# Patient Record
Sex: Female | Born: 1995 | Race: White | Hispanic: No | Marital: Married | Smoking: Former smoker
Health system: Southern US, Community
[De-identification: ages and names within clinical notes are randomized; demographics above are authoritative.]

## PROBLEM LIST (undated history)

## (undated) ENCOUNTER — Inpatient Hospital Stay (HOSPITAL_COMMUNITY): Payer: Self-pay

## (undated) DIAGNOSIS — N83209 Unspecified ovarian cyst, unspecified side: Secondary | ICD-10-CM

## (undated) DIAGNOSIS — F32A Depression, unspecified: Secondary | ICD-10-CM

## (undated) DIAGNOSIS — D649 Anemia, unspecified: Secondary | ICD-10-CM

## (undated) DIAGNOSIS — J45909 Unspecified asthma, uncomplicated: Secondary | ICD-10-CM

## (undated) DIAGNOSIS — I951 Orthostatic hypotension: Secondary | ICD-10-CM

## (undated) DIAGNOSIS — B999 Unspecified infectious disease: Secondary | ICD-10-CM

## (undated) DIAGNOSIS — F419 Anxiety disorder, unspecified: Secondary | ICD-10-CM

## (undated) DIAGNOSIS — R400 Somnolence: Secondary | ICD-10-CM

## (undated) DIAGNOSIS — F329 Major depressive disorder, single episode, unspecified: Secondary | ICD-10-CM

## (undated) DIAGNOSIS — Z87440 Personal history of urinary (tract) infections: Secondary | ICD-10-CM

## (undated) HISTORY — DX: Depression, unspecified: F32.A

## (undated) HISTORY — DX: Anxiety disorder, unspecified: F41.9

## (undated) HISTORY — DX: Somnolence: R40.0

## (undated) HISTORY — DX: Personal history of urinary (tract) infections: Z87.440

## (undated) HISTORY — PX: NO PAST SURGERIES: SHX2092

---
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
## 2017-03-01 ENCOUNTER — Telehealth (HOSPITAL_COMMUNITY): Payer: Self-pay | Admitting: *Deleted

## 2017-03-01 ENCOUNTER — Encounter (HOSPITAL_COMMUNITY): Payer: Self-pay

## 2017-03-01 ENCOUNTER — Other Ambulatory Visit (HOSPITAL_COMMUNITY): Payer: Self-pay | Admitting: *Deleted

## 2017-03-01 DIAGNOSIS — M2609 Other specified anomalies of jaw size: Secondary | ICD-10-CM

## 2017-03-02 ENCOUNTER — Encounter (HOSPITAL_COMMUNITY): Payer: Self-pay

## 2017-03-02 ENCOUNTER — Ambulatory Visit (HOSPITAL_COMMUNITY)
Admission: RE | Admit: 2017-03-02 | Discharge: 2017-03-02 | Disposition: A | Discharge status: Home / Self Care | Payer: 59 | Source: Ambulatory Visit | Attending: Nurse Practitioner | Admitting: Nurse Practitioner

## 2017-03-02 ENCOUNTER — Ambulatory Visit (HOSPITAL_COMMUNITY)
Admission: RE | Admit: 2017-03-02 | Discharge: 2017-03-02 | Disposition: A | Discharge status: Home / Self Care | Payer: 59 | Source: Ambulatory Visit | Attending: Obstetrics & Gynecology | Admitting: Obstetrics & Gynecology

## 2017-03-02 ENCOUNTER — Other Ambulatory Visit (HOSPITAL_COMMUNITY): Payer: Self-pay | Admitting: Maternal and Fetal Medicine

## 2017-03-02 DIAGNOSIS — Z3A22 22 weeks gestation of pregnancy: Secondary | ICD-10-CM | POA: Insufficient documentation

## 2017-03-02 DIAGNOSIS — O283 Abnormal ultrasonic finding on antenatal screening of mother: Secondary | ICD-10-CM | POA: Insufficient documentation

## 2017-03-02 DIAGNOSIS — M2609 Other specified anomalies of jaw size: Secondary | ICD-10-CM

## 2017-03-02 DIAGNOSIS — Z363 Encounter for antenatal screening for malformations: Secondary | ICD-10-CM | POA: Diagnosis not present

## 2017-03-02 DIAGNOSIS — O35AXX0 Maternal care for other (suspected) fetal abnormality and damage, fetal facial anomalies, not applicable or unspecified: Secondary | ICD-10-CM | POA: Insufficient documentation

## 2017-03-02 DIAGNOSIS — O358XX0 Maternal care for other (suspected) fetal abnormality and damage, not applicable or unspecified: Secondary | ICD-10-CM

## 2017-03-02 HISTORY — DX: Major depressive disorder, single episode, unspecified: F32.9

## 2017-03-02 HISTORY — DX: Anxiety disorder, unspecified: F41.9

## 2017-03-02 HISTORY — DX: Depression, unspecified: F32.A

## 2017-03-02 HISTORY — DX: Unspecified ovarian cyst, unspecified side: N83.209

## 2017-03-02 NOTE — Addendum Note (Signed)
Encounter addended by: Drue NovelKeatts, Sherran Margolis J, RDMS on: 03/02/2017  3:02 PM<BR>    Actions taken: Imaging Exam ended

## 2017-03-02 NOTE — Consult Note (Signed)
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 Langston MaskerMorris' office and was noted to have a small jaw. She was referred for evaluation. This pregnancy has been unremarkable so far. First trimester screen was negative, second trimester MSAFP was normal, and SMN screening was 2-copy reduced risk. There is no significant genetic history in the immediate family OB History: OB History    Gravida Para Term Preterm AB Living   1             SAB TAB Ectopic Multiple Live Births                  PMH:  Past Medical History:  Diagnosis Date  . Anxiety   . Asthma   . Depression   . Ovarian cyst     PSH: No past surgical history on file. Meds: see EPIC section Allergies: NKDA FH: see EPIC section Soc: see EPIC section  Review of Systems: no vaginal bleeding or cramping/contractions, no LOF, no nausea/vomiting. All other systems reviewed and are negative.  PE:  VS: see EPIC section GEN: well-appearing female ABD: gravid, NT  Please see separate document for fetal ultrasound report.  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 Profile views are certainly worrisome for a small jaw but it seems to be borderline between a cosmetic level of micrognathia and a pathologically shortened mandible. There are no other findings on scan that suggest that this is a part of any genetic syndrome. In summary, further evaluation will be necessary. I have asked the patient to return for repeat scan in 4 weeks. I discussed the findings with the patient and the need for possible delivery in a tertiary care center with pediatric ENT support if the baby does prove to have an abnormally small mandible. It may take several evaluations before that decision can  be made.  Thank you for the opportunity to be a part of the care of Anne [name]. Please contact our office if we can be of further assistance.   I spent approximately 30 minutes with this patient with over 50% of time spent in face-to-face counseling.

## 2017-03-03 ENCOUNTER — Other Ambulatory Visit (HOSPITAL_COMMUNITY): Payer: Self-pay

## 2017-03-03 ENCOUNTER — Other Ambulatory Visit (HOSPITAL_COMMUNITY): Payer: Self-pay | Admitting: *Deleted

## 2017-03-03 ENCOUNTER — Encounter (HOSPITAL_COMMUNITY): Payer: Self-pay

## 2017-03-03 DIAGNOSIS — O358XX0 Maternal care for other (suspected) fetal abnormality and damage, not applicable or unspecified: Secondary | ICD-10-CM

## 2017-03-03 DIAGNOSIS — O35AXX0 Maternal care for other (suspected) fetal abnormality and damage, fetal facial anomalies, not applicable or unspecified: Secondary | ICD-10-CM

## 2017-03-30 ENCOUNTER — Encounter (HOSPITAL_COMMUNITY): Payer: Self-pay

## 2017-03-30 ENCOUNTER — Ambulatory Visit (HOSPITAL_COMMUNITY)
Admission: RE | Admit: 2017-03-30 | Discharge: 2017-03-30 | Disposition: A | Discharge status: Home / Self Care | Payer: 59 | Source: Ambulatory Visit | Attending: Obstetrics & Gynecology | Admitting: Obstetrics & Gynecology

## 2017-03-30 ENCOUNTER — Other Ambulatory Visit (HOSPITAL_COMMUNITY): Payer: Self-pay | Admitting: Obstetrics and Gynecology

## 2017-03-30 DIAGNOSIS — Z3A26 26 weeks gestation of pregnancy: Secondary | ICD-10-CM

## 2017-03-30 DIAGNOSIS — O358XX0 Maternal care for other (suspected) fetal abnormality and damage, not applicable or unspecified: Secondary | ICD-10-CM

## 2017-03-30 DIAGNOSIS — O321XX0 Maternal care for breech presentation, not applicable or unspecified: Secondary | ICD-10-CM | POA: Insufficient documentation

## 2017-03-30 DIAGNOSIS — O35AXX0 Maternal care for other (suspected) fetal abnormality and damage, fetal facial anomalies, not applicable or unspecified: Secondary | ICD-10-CM

## 2017-03-30 DIAGNOSIS — O283 Abnormal ultrasonic finding on antenatal screening of mother: Secondary | ICD-10-CM | POA: Insufficient documentation

## 2017-03-30 DIAGNOSIS — Z362 Encounter for other antenatal screening follow-up: Secondary | ICD-10-CM | POA: Diagnosis not present

## 2017-05-01 ENCOUNTER — Other Ambulatory Visit (HOSPITAL_COMMUNITY): Payer: Self-pay | Admitting: *Deleted

## 2017-05-01 DIAGNOSIS — O359XX0 Maternal care for (suspected) fetal abnormality and damage, unspecified, not applicable or unspecified: Secondary | ICD-10-CM

## 2017-05-05 ENCOUNTER — Ambulatory Visit (HOSPITAL_COMMUNITY)
Admission: RE | Admit: 2017-05-05 | Discharge: 2017-05-05 | Disposition: A | Discharge status: Home / Self Care | Payer: 59 | Source: Ambulatory Visit | Attending: Obstetrics & Gynecology | Admitting: Obstetrics & Gynecology

## 2017-05-05 ENCOUNTER — Encounter (HOSPITAL_COMMUNITY): Payer: Self-pay

## 2017-05-05 DIAGNOSIS — O359XX0 Maternal care for (suspected) fetal abnormality and damage, unspecified, not applicable or unspecified: Secondary | ICD-10-CM | POA: Insufficient documentation

## 2017-05-05 DIAGNOSIS — O283 Abnormal ultrasonic finding on antenatal screening of mother: Secondary | ICD-10-CM | POA: Diagnosis not present

## 2017-05-05 DIAGNOSIS — Z362 Encounter for other antenatal screening follow-up: Secondary | ICD-10-CM | POA: Insufficient documentation

## 2017-05-05 DIAGNOSIS — Z3A31 31 weeks gestation of pregnancy: Secondary | ICD-10-CM | POA: Diagnosis not present

## 2017-05-08 ENCOUNTER — Other Ambulatory Visit (HOSPITAL_COMMUNITY): Payer: Self-pay | Admitting: *Deleted

## 2017-05-08 DIAGNOSIS — O359XX0 Maternal care for (suspected) fetal abnormality and damage, unspecified, not applicable or unspecified: Secondary | ICD-10-CM

## 2017-05-16 NOTE — L&D Delivery Note (Signed)
Delivery Note At 10:54 PM a viable female was delivered via Vaginal, Spontaneous (Presentation: LOA;  ).  APGAR: 7, 9; weight pending.    Placenta status: routine , .  Cord:  with the following complications: fetal tachycardia to 180s last ~10 min of pushing.  Cord pH: sent, 7.13 Mild uterine atony requiring 800mcg cytotec and draining the bladder with resolution of bleeding Periurethral repaired with red rubber in place to ensure proper repair  Anesthesia:  None Episiotomy: None Lacerations: 2nd degree;Periurethral Suture Repair: 3.0 vicryl Est. Blood Loss (mL): 400  It's a girl - "Anne [name]"! Mom to postpartum.  Baby to Couplet care / Skin to Skin.  Ranae Pilalise Jennifer Sufian Ravi 07/02/2017, 11:29 PM

## 2017-06-02 ENCOUNTER — Ambulatory Visit (HOSPITAL_COMMUNITY)
Admission: RE | Admit: 2017-06-02 | Discharge: 2017-06-02 | Disposition: A | Discharge status: Home / Self Care | Payer: Managed Care, Other (non HMO) | Source: Ambulatory Visit | Attending: Obstetrics & Gynecology | Admitting: Obstetrics & Gynecology

## 2017-06-02 ENCOUNTER — Encounter (HOSPITAL_COMMUNITY): Payer: Self-pay

## 2017-06-02 DIAGNOSIS — Z3A35 35 weeks gestation of pregnancy: Secondary | ICD-10-CM | POA: Insufficient documentation

## 2017-06-02 DIAGNOSIS — O359XX0 Maternal care for (suspected) fetal abnormality and damage, unspecified, not applicable or unspecified: Secondary | ICD-10-CM | POA: Insufficient documentation

## 2017-06-07 LAB — OB RESULTS CONSOLE GBS: STREP GROUP B AG: NEGATIVE

## 2017-06-25 ENCOUNTER — Inpatient Hospital Stay (HOSPITAL_COMMUNITY): Payer: 59

## 2017-06-25 ENCOUNTER — Encounter (HOSPITAL_COMMUNITY): Payer: Self-pay

## 2017-06-25 ENCOUNTER — Inpatient Hospital Stay (HOSPITAL_COMMUNITY)
Admission: AD | Admit: 2017-06-25 | Discharge: 2017-06-25 | Disposition: A | Discharge status: Home / Self Care | Payer: 59 | Source: Ambulatory Visit | Attending: Obstetrics and Gynecology | Admitting: Obstetrics and Gynecology

## 2017-06-25 DIAGNOSIS — Z3689 Encounter for other specified antenatal screening: Secondary | ICD-10-CM | POA: Insufficient documentation

## 2017-06-25 DIAGNOSIS — O368130 Decreased fetal movements, third trimester, not applicable or unspecified: Secondary | ICD-10-CM | POA: Insufficient documentation

## 2017-06-25 DIAGNOSIS — F419 Anxiety disorder, unspecified: Secondary | ICD-10-CM | POA: Diagnosis not present

## 2017-06-25 DIAGNOSIS — O99513 Diseases of the respiratory system complicating pregnancy, third trimester: Secondary | ICD-10-CM | POA: Insufficient documentation

## 2017-06-25 DIAGNOSIS — Z3A39 39 weeks gestation of pregnancy: Secondary | ICD-10-CM | POA: Diagnosis present

## 2017-06-25 DIAGNOSIS — O3483 Maternal care for other abnormalities of pelvic organs, third trimester: Secondary | ICD-10-CM | POA: Diagnosis not present

## 2017-06-25 DIAGNOSIS — N83209 Unspecified ovarian cyst, unspecified side: Secondary | ICD-10-CM | POA: Insufficient documentation

## 2017-06-25 DIAGNOSIS — Z79899 Other long term (current) drug therapy: Secondary | ICD-10-CM | POA: Insufficient documentation

## 2017-06-25 DIAGNOSIS — Z87891 Personal history of nicotine dependence: Secondary | ICD-10-CM | POA: Diagnosis not present

## 2017-06-25 DIAGNOSIS — O99343 Other mental disorders complicating pregnancy, third trimester: Secondary | ICD-10-CM | POA: Diagnosis not present

## 2017-06-25 DIAGNOSIS — J45909 Unspecified asthma, uncomplicated: Secondary | ICD-10-CM | POA: Diagnosis not present

## 2017-06-25 DIAGNOSIS — F329 Major depressive disorder, single episode, unspecified: Secondary | ICD-10-CM | POA: Insufficient documentation

## 2017-06-25 DIAGNOSIS — O368131 Decreased fetal movements, third trimester, fetus 1: Secondary | ICD-10-CM

## 2017-06-25 DIAGNOSIS — O288 Other abnormal findings on antenatal screening of mother: Secondary | ICD-10-CM

## 2017-06-25 NOTE — MAU Note (Signed)
Patient presents with decreased fetal movement, stating baby not as active as usual.

## 2017-06-25 NOTE — MAU Provider Note (Signed)
Chief Complaint:  Decreased Fetal Movement   None     HPI: Anne FeilSarah J [name] is a 22 y.o. G1P0 at 4642w1d who presents to maternity admissions reporting decreased fetal movement x 2 days. She reports that yesterday she noticed less movement, despite eating and drinking normally, and resting on her side.  Today, she is feeling movement, but it continues to be less than usual.  She feels an increase in movement a few minutes after monitors are applied in MAU.  There are no other associated symptoms and no other treatments were tried.   HPI  Past Medical History: Past Medical History:  Diagnosis Date  . Anxiety   . Asthma   . Depression   . Ovarian cyst     Past obstetric history: OB History  Gravida Para Term Preterm AB Living  1         0  SAB TAB Ectopic Multiple Live Births               # Outcome Date GA Lbr Len/2nd Weight Sex Delivery Anes PTL Lv  1 Current               Past Surgical History: History reviewed. No pertinent surgical history.  Family History: Family History  Problem Relation Age of Onset  . Depression Mother   . Anxiety disorder Mother   . Depression Maternal Grandmother   . Anxiety disorder Maternal Grandmother   . Depression Maternal Grandfather   . Anxiety disorder Maternal Grandfather     Social History: Social History   Tobacco Use  . Smoking status: Former Games developermoker  . Smokeless tobacco: Never Used  Substance Use Topics  . Alcohol use: No    Alcohol/week: 0.0 oz  . Drug use: No    Allergies:  Allergies  Allergen Reactions  . Other     Seasonal Allergies    Meds:  No medications prior to admission.    ROS:  Review of Systems  Constitutional: Negative for chills, fatigue and fever.  Eyes: Negative for visual disturbance.  Respiratory: Negative for shortness of breath.   Cardiovascular: Negative for chest pain.  Gastrointestinal: Negative for abdominal pain, nausea and vomiting.  Genitourinary: Negative for difficulty  urinating, dysuria, flank pain, pelvic pain, vaginal bleeding, vaginal discharge and vaginal pain.  Neurological: Negative for dizziness and headaches.  Psychiatric/Behavioral: Negative.      I have reviewed patient's Past Medical Hx, Surgical Hx, Family Hx, Social Hx, medications and allergies.   Physical Exam   Patient Vitals for the past 24 hrs:  BP Temp Temp src Pulse Resp Height Weight  06/25/17 1127 138/77 - - - - - -  06/25/17 0901 136/87 98.7 F (37.1 C) Oral 99 18 - -  06/25/17 0852 - - - - - 5\' 1"  (1.549 m) 159 lb (72.1 kg)   Constitutional: Well-developed, well-nourished female in no acute distress.  Cardiovascular: normal rate Respiratory: normal effort GI: Abd soft, non-tender, gravid appropriate for gestational age.  MS: Extremities nontender, no edema, normal ROM Neurologic: Alert and oriented x 4.  GU: Neg CVAT.     FHT:  Baseline 130 , moderate variability, accelerations present, deceleration x1 present with FHR down to 90s, return to 150, down to 100s all lasting 1 minute when monitors first applied Contractions: some irritability, mild to palpation   Labs: No results found for this or any previous visit (from the past 24 hour(s)). O/Positive/-- (07/17 0000)  Imaging:    MAU Course/MDM:  NST reviewed.  Nonreactive with initial decel but reactive with accels following decel x1 BPP ordered, result 8/8 Pt feeling normal movement in MAU Consult Dr Rana Snare with presentation, exam findings and test results.  Dr Rana Snare reviewed FHR tracing D/C home with fetal kick counts and labor precautions reviewed   Assessment: 1. NST (non-stress test) reactive   2. NST (non-stress test) nonreactive   3. Decreased fetal movements, third trimester, fetus 1   4. [redacted] weeks gestation of pregnancy     Plan: Discharge home Labor precautions and fetal kick counts  Follow-up Information    [place] Follow up.   Why:  As scheduled, return to MAU  as needed for emergencies Contact information: [address] [place] [address] [phone]          Allergies as of 06/25/2017      Reactions   Other    Seasonal Allergies      Medication List    TAKE these medications   albuterol 108 (90 Base) MCG/ACT inhaler Commonly known as:  PROVENTIL HFA;VENTOLIN HFA Inhale 1-2 puffs into the lungs every 6 (six) hours as needed for wheezing or shortness of breath.   calcium carbonate 500 MG chewable tablet Commonly known as:  TUMS - dosed in mg elemental calcium Chew 1-2 tablets by mouth daily as needed for indigestion or heartburn.   diphenhydrAMINE 25 MG tablet Commonly known as:  BENADRYL Take 25 mg by mouth daily as needed for allergies.       Sharen Counter Certified Nurse-Midwife 06/25/2017 7:11 PM

## 2017-06-26 ENCOUNTER — Telehealth (HOSPITAL_COMMUNITY): Payer: Self-pay | Admitting: *Deleted

## 2017-06-26 ENCOUNTER — Encounter (HOSPITAL_COMMUNITY): Payer: Self-pay | Admitting: *Deleted

## 2017-06-26 NOTE — Telephone Encounter (Signed)
Preadmission screen  

## 2017-07-02 ENCOUNTER — Inpatient Hospital Stay (HOSPITAL_COMMUNITY)
Admission: AD | Admit: 2017-07-02 | Discharge: 2017-07-04 | DRG: 807 | Disposition: A | Discharge status: Home / Self Care | Payer: 59 | Source: Ambulatory Visit | Attending: Obstetrics and Gynecology | Admitting: Obstetrics and Gynecology

## 2017-07-02 ENCOUNTER — Encounter (HOSPITAL_COMMUNITY): Payer: Self-pay

## 2017-07-02 ENCOUNTER — Other Ambulatory Visit: Payer: Self-pay

## 2017-07-02 DIAGNOSIS — Z3483 Encounter for supervision of other normal pregnancy, third trimester: Secondary | ICD-10-CM | POA: Diagnosis present

## 2017-07-02 DIAGNOSIS — Z3490 Encounter for supervision of normal pregnancy, unspecified, unspecified trimester: Secondary | ICD-10-CM

## 2017-07-02 DIAGNOSIS — O358XX0 Maternal care for other (suspected) fetal abnormality and damage, not applicable or unspecified: Principal | ICD-10-CM | POA: Diagnosis present

## 2017-07-02 DIAGNOSIS — Z3A40 40 weeks gestation of pregnancy: Secondary | ICD-10-CM | POA: Diagnosis not present

## 2017-07-02 DIAGNOSIS — Z87891 Personal history of nicotine dependence: Secondary | ICD-10-CM

## 2017-07-02 LAB — PROTEIN / CREATININE RATIO, URINE
CREATININE, URINE: 97 mg/dL
PROTEIN CREATININE RATIO: 0.22 mg/mg{creat} — AB (ref 0.00–0.15)
TOTAL PROTEIN, URINE: 21 mg/dL

## 2017-07-02 LAB — COMPREHENSIVE METABOLIC PANEL
ALT: 7 U/L — ABNORMAL LOW (ref 14–54)
AST: 17 U/L (ref 15–41)
Albumin: 3 g/dL — ABNORMAL LOW (ref 3.5–5.0)
Alkaline Phosphatase: 157 U/L — ABNORMAL HIGH (ref 38–126)
Anion gap: 10 (ref 5–15)
BUN: 7 mg/dL (ref 6–20)
CHLORIDE: 106 mmol/L (ref 101–111)
CO2: 17 mmol/L — AB (ref 22–32)
Calcium: 8.7 mg/dL — ABNORMAL LOW (ref 8.9–10.3)
Creatinine, Ser: 0.49 mg/dL (ref 0.44–1.00)
GFR calc Af Amer: >60 mL/min (ref >60)
GFR calc non Af Amer: >60 mL/min (ref >60)
GLUCOSE: 94 mg/dL (ref 65–99)
Potassium: 3.8 mmol/L (ref 3.5–5.1)
SODIUM: 133 mmol/L — AB (ref 135–145)
Total Bilirubin: 0.8 mg/dL (ref 0.3–1.2)
Total Protein: 6.8 g/dL (ref 6.5–8.1)

## 2017-07-02 LAB — CBC WITH DIFFERENTIAL/PLATELET
BASOS ABS: 0 10*3/uL (ref 0.0–0.1)
Basophils Relative: 0 %
EOS ABS: 0 10*3/uL (ref 0.0–0.7)
EOS PCT: 0 %
HCT: 29.6 % — ABNORMAL LOW (ref 36.0–46.0)
HEMOGLOBIN: 9.8 g/dL — AB (ref 12.0–15.0)
LYMPHS PCT: 14 %
Lymphs Abs: 2 10*3/uL (ref 0.7–4.0)
MCH: 28.2 pg (ref 26.0–34.0)
MCHC: 33.1 g/dL (ref 30.0–36.0)
MCV: 85.1 fL (ref 78.0–100.0)
Monocytes Absolute: 0.7 10*3/uL (ref 0.1–1.0)
Monocytes Relative: 5 %
NEUTROS PCT: 81 %
Neutro Abs: 11.5 10*3/uL — ABNORMAL HIGH (ref 1.7–7.7)
PLATELETS: 272 10*3/uL (ref 150–400)
RBC: 3.48 MIL/uL — AB (ref 3.87–5.11)
RDW: 13.5 % (ref 11.5–15.5)
WBC: 14.2 10*3/uL — ABNORMAL HIGH (ref 4.0–10.5)

## 2017-07-02 LAB — TYPE AND SCREEN
ABO/RH(D): O POS
ANTIBODY SCREEN: NEGATIVE

## 2017-07-02 MED ORDER — IBUPROFEN 600 MG PO TABS
600.0000 mg | ORAL_TABLET | Freq: Four times a day (QID) | ORAL | Status: DC
  Administered 2017-07-03 – 2017-07-04 (×8): 600 mg via ORAL
  Filled 2017-07-02 (×7): qty 1

## 2017-07-02 MED ORDER — LIDOCAINE HCL (PF) 1 % IJ SOLN
30.0000 mL | INTRAMUSCULAR | Status: DC | PRN
  Administered 2017-07-02: 30 mL via SUBCUTANEOUS
  Filled 2017-07-02: qty 30

## 2017-07-02 MED ORDER — FENTANYL CITRATE (PF) 100 MCG/2ML IJ SOLN
50.0000 ug | INTRAMUSCULAR | Status: DC | PRN
  Administered 2017-07-02: 100 ug via INTRAVENOUS
  Filled 2017-07-02: qty 2

## 2017-07-02 MED ORDER — ACETAMINOPHEN 325 MG PO TABS: 650.0000 mg | ORAL_TABLET | ORAL | Status: DC | PRN

## 2017-07-02 MED ORDER — MISOPROSTOL 200 MCG PO TABS
ORAL_TABLET | ORAL | Status: AC
  Filled 2017-07-02: qty 4

## 2017-07-02 MED ORDER — OXYTOCIN BOLUS FROM INFUSION
500.0000 mL | Freq: Once | INTRAVENOUS | Status: AC
  Administered 2017-07-02: 500 mL via INTRAVENOUS

## 2017-07-02 MED ORDER — OXYCODONE-ACETAMINOPHEN 5-325 MG PO TABS: 2.0000 | ORAL_TABLET | ORAL | Status: DC | PRN

## 2017-07-02 MED ORDER — OXYCODONE-ACETAMINOPHEN 5-325 MG PO TABS: 1.0000 | ORAL_TABLET | ORAL | Status: DC | PRN

## 2017-07-02 MED ORDER — MISOPROSTOL 200 MCG PO TABS
800.0000 ug | ORAL_TABLET | Freq: Once | ORAL | Status: AC
  Administered 2017-07-02: 800 ug via RECTAL

## 2017-07-02 MED ORDER — CARBOPROST TROMETHAMINE 250 MCG/ML IM SOLN
250.0000 ug | Freq: Once | INTRAMUSCULAR | Status: AC
  Administered 2017-07-03: 250 ug via INTRAMUSCULAR
  Filled 2017-07-02: qty 1

## 2017-07-02 MED ORDER — OXYTOCIN 40 UNITS IN LACTATED RINGERS INFUSION - SIMPLE MED
2.5000 [IU]/h | INTRAVENOUS | Status: DC
  Administered 2017-07-02: 2.5 [IU]/h via INTRAVENOUS
  Filled 2017-07-02: qty 1000

## 2017-07-02 MED ORDER — LACTATED RINGERS IV SOLN
INTRAVENOUS | Status: DC
  Administered 2017-07-02: 18:00:00 via INTRAVENOUS

## 2017-07-02 MED ORDER — CARBOPROST TROMETHAMINE 250 MCG/ML IM SOLN
INTRAMUSCULAR | Status: AC
  Filled 2017-07-02: qty 1

## 2017-07-02 MED ORDER — FLEET ENEMA 7-19 GM/118ML RE ENEM: 1.0000 | ENEMA | RECTAL | Status: DC | PRN

## 2017-07-02 MED ORDER — ONDANSETRON HCL 4 MG/2ML IJ SOLN
4.0000 mg | Freq: Four times a day (QID) | INTRAMUSCULAR | Status: DC | PRN
  Administered 2017-07-02: 4 mg via INTRAVENOUS
  Filled 2017-07-02: qty 2

## 2017-07-02 MED ORDER — SOD CITRATE-CITRIC ACID 500-334 MG/5ML PO SOLN: 30.0000 mL | ORAL | Status: DC | PRN

## 2017-07-02 MED ORDER — LACTATED RINGERS IV SOLN: 500.0000 mL | INTRAVENOUS | Status: DC | PRN

## 2017-07-02 NOTE — Progress Notes (Signed)
C/c/+1, will start to push 

## 2017-07-02 NOTE — MAU Note (Addendum)
Pt has been having ctx since 2pm today. No bleeding, no LOF. + FM

## 2017-07-02 NOTE — H&P (Signed)
Anne FeilSarah J [name] is a 22 y.o. female presenting for labor, cervical change from 1cm to 2.5cm in MAU. OB History    Gravida Para Term Preterm AB Living   1         0   SAB TAB Ectopic Multiple Live Births                 Past Medical History:  Diagnosis Date  . Anxiety   . Asthma   . Depression   . Ovarian cyst    Past Surgical History:  Procedure Laterality Date  . NO PAST SURGERIES     Family History: family history includes Anxiety disorder in her maternal grandfather, maternal grandmother, and mother; Depression in her maternal grandfather, maternal grandmother, and mother. Social History:  reports that she has quit smoking. she has never used smokeless tobacco. She reports that she does not drink alcohol or use drugs.     Maternal Diabetes: No Genetic Screening: Normal Maternal Ultrasounds/Referrals: Normal Fetal Ultrasounds or other Referrals:  Referred to Materal Fetal Medicine , Other:   Micrognathia Maternal Substance Abuse:  No Significant Maternal Medications:  None Significant Maternal Lab Results:  None Other Comments:  None  ROS History Dilation: 2.5 Effacement (%): 80 Station: -2 Exam by:: Ginnie Smartachel Schmidt RN Blood pressure 123/73, pulse 82, temperature 98 F (36.7 C), temperature source Oral, resp. rate 16, height 5\' 1"  (1.549 m), weight 68.9 kg (152 lb), last menstrual period 09/09/2016. Exam Physical Exam  Prenatal labs: ABO, Rh: O/Positive/-- (07/17 0000) Antibody: Negative (07/17 0000) Rubella: Immune (07/17 0000) RPR: Nonreactive (07/17 0000)  HBsAg: Negative (07/17 0000)  HIV: Non-reactive (07/17 0000)  GBS: Negative (01/23 0000)   Assessment/Plan: 21yo G1P0 @40 .1wga presenting in early labor. Also had two elevated BP on arrival in 130s/90s - now WNL. PIH labs WNL. Pregnancy c/b micrognathia with normal first trimester screen. She has a h/o anxiety/depression and asthma that is well controlled. Plan for expectant management at this time - patient  would like "a natural birth". She is GBS neg.    Ranae Pilalise Jennifer [name] 07/02/2017, 7:20 PM

## 2017-07-03 ENCOUNTER — Encounter (HOSPITAL_COMMUNITY): Payer: Self-pay

## 2017-07-03 LAB — CBC
HCT: 25.2 % — ABNORMAL LOW (ref 36.0–46.0)
HEMOGLOBIN: 8.7 g/dL — AB (ref 12.0–15.0)
MCH: 28.8 pg (ref 26.0–34.0)
MCHC: 34.5 g/dL (ref 30.0–36.0)
MCV: 83.4 fL (ref 78.0–100.0)
Platelets: 249 10*3/uL (ref 150–400)
RBC: 3.02 MIL/uL — ABNORMAL LOW (ref 3.87–5.11)
RDW: 13.7 % (ref 11.5–15.5)
WBC: 26.9 10*3/uL — AB (ref 4.0–10.5)

## 2017-07-03 LAB — RPR: RPR Ser Ql: NONREACTIVE

## 2017-07-03 LAB — ABO/RH: ABO/RH(D): O POS

## 2017-07-03 MED ORDER — SIMETHICONE 80 MG PO CHEW: 80.0000 mg | CHEWABLE_TABLET | ORAL | Status: DC | PRN

## 2017-07-03 MED ORDER — OXYCODONE HCL 5 MG PO TABS: 10.0000 mg | ORAL_TABLET | ORAL | Status: DC | PRN

## 2017-07-03 MED ORDER — OXYCODONE HCL 5 MG PO TABS
5.0000 mg | ORAL_TABLET | ORAL | Status: DC | PRN
  Administered 2017-07-03: 5 mg via ORAL
  Filled 2017-07-03: qty 1

## 2017-07-03 MED ORDER — SENNOSIDES-DOCUSATE SODIUM 8.6-50 MG PO TABS
2.0000 | ORAL_TABLET | ORAL | Status: DC
  Administered 2017-07-03 (×2): 2 via ORAL
  Filled 2017-07-03 (×2): qty 2

## 2017-07-03 MED ORDER — COCONUT OIL OIL: 1.0000 "application " | TOPICAL_OIL | Status: DC | PRN

## 2017-07-03 MED ORDER — TETANUS-DIPHTH-ACELL PERTUSSIS 5-2.5-18.5 LF-MCG/0.5 IM SUSP: 0.5000 mL | Freq: Once | INTRAMUSCULAR | Status: DC

## 2017-07-03 MED ORDER — ONDANSETRON HCL 4 MG/2ML IJ SOLN: 4.0000 mg | INTRAMUSCULAR | Status: DC | PRN

## 2017-07-03 MED ORDER — DIPHENHYDRAMINE HCL 25 MG PO CAPS: 25.0000 mg | ORAL_CAPSULE | Freq: Four times a day (QID) | ORAL | Status: DC | PRN

## 2017-07-03 MED ORDER — ONDANSETRON HCL 4 MG PO TABS: 4.0000 mg | ORAL_TABLET | ORAL | Status: DC | PRN

## 2017-07-03 MED ORDER — ACETAMINOPHEN 325 MG PO TABS: 650.0000 mg | ORAL_TABLET | ORAL | Status: DC | PRN

## 2017-07-03 MED ORDER — WITCH HAZEL-GLYCERIN EX PADS: 1.0000 "application " | MEDICATED_PAD | CUTANEOUS | Status: DC | PRN

## 2017-07-03 MED ORDER — BENZOCAINE-MENTHOL 20-0.5 % EX AERO
1.0000 "application " | INHALATION_SPRAY | CUTANEOUS | Status: DC | PRN
  Administered 2017-07-03: 1 via TOPICAL
  Filled 2017-07-03: qty 56

## 2017-07-03 MED ORDER — ZOLPIDEM TARTRATE 5 MG PO TABS
5.0000 mg | ORAL_TABLET | Freq: Every evening | ORAL | Status: DC | PRN
  Administered 2017-07-04 (×2): 5 mg via ORAL
  Filled 2017-07-03 (×2): qty 1

## 2017-07-03 MED ORDER — PRENATAL MULTIVITAMIN CH
1.0000 | ORAL_TABLET | Freq: Every day | ORAL | Status: DC
  Administered 2017-07-03 – 2017-07-04 (×2): 1 via ORAL
  Filled 2017-07-03 (×2): qty 1

## 2017-07-03 MED ORDER — ALBUTEROL SULFATE (2.5 MG/3ML) 0.083% IN NEBU: 3.0000 mL | INHALATION_SOLUTION | Freq: Four times a day (QID) | RESPIRATORY_TRACT | Status: DC | PRN

## 2017-07-03 MED ORDER — DIBUCAINE 1 % RE OINT: 1.0000 "application " | TOPICAL_OINTMENT | RECTAL | Status: DC | PRN

## 2017-07-03 NOTE — Lactation Note (Addendum)
This note was copied from a baby's chart. Lactation Consultation Note  Patient Name: Anne [name] ZOXWR'UToday's Date: 07/03/2017   Baby with cleft palate in NICU. Mother resting.  Spoke with grandmother. Mother has been pumping but not since early this morning. Recommend pumping. q 2.5-3 hours. LC will return later when mother wakes.  Called to room since mother was now awake. Recommend pumping to motherq 2-5-3 hours. Offered to help with pumping and hand expression and mother declined. Provided mother with yellow dot labels, lacatation brochure, colostrum containers and NICU booklet. Mother falling asleep during consult. Informed RN and she will look out for breastmilk labels from pharmacy.        Maternal Data    Feeding Feeding Type: Bottle Fed - Formula Nipple Type: Dr. Irving BurtonBrowns Preemie(Dr. Brown's speciality ) Length of feed: 15 min  LATCH Score                   Interventions    Lactation Tools Discussed/Used     Consult Status      Dahlia ByesBerkelhammer, [facility] [place] 07/03/2017, 11:04 AM

## 2017-07-03 NOTE — Progress Notes (Signed)
Post Partum Day 1 Subjective: no complaints  Objective: Blood pressure (!) 117/59, pulse 79, temperature 98.6 F (37 C), temperature source Oral, resp. rate 20, height 5\' 1"  (1.549 m), weight 152 lb (68.9 kg), last menstrual period 09/09/2016, SpO2 99 %, unknown if currently breastfeeding.  Physical Exam:  General: alert Lochia: appropriate Uterine Fundus: firm Incision: healing well DVT Evaluation: No evidence of DVT seen on physical exam.  Recent Labs    07/02/17 1629 07/03/17 0515  HGB 9.8* 8.7*  HCT 29.6* 25.2*    Assessment/Plan: Plan for discharge tomorrow   LOS: 1 day   Anne PicaRichard M Naelle Diegel 07/03/2017, 8:05 AM

## 2017-07-03 NOTE — Clinical Social Work Maternal (Signed)
CLINICAL SOCIAL WORK MATERNAL/CHILD NOTE  Patient Details  Name: Anne [name] MRN: [id number] Date of Birth: 04/15/1996  Date:  07/03/2017  Clinical Social Worker Initiating Note:  Bobetta Korf Boyd-Gilyard Date/Time: Initiated:  07/03/17/1456     Child's Name:  Anne [name]   Biological Parents:  Mother, Father   Need for Interpreter:  None   Reason for Referral:  Parental Support of Premature Babies < 32 weeks/or Critically Ill babies   Address:  [address] [place] [address]    Phone number:  [phone] (home)     Additional phone number: [name]'s telephone number is [phone]  Household Members/Support Persons (HM/SP):   Household Member/Support Person 1   HM/SP Name Relationship DOB or Age  HM/SP -1 [name] [name] 02/18/1997  HM/SP -2        HM/SP -3        HM/SP -4        HM/SP -5        HM/SP -6        HM/SP -7        HM/SP -8          Natural Supports (not living in the home):  Immediate Family, Extended Family, Parent([name]'s family will also provide support to the [name] and family.)   Professional Supports:     Employment: Full-time   Type of Work: Pizza Delivery for [employer]   Education:  9 to 11 years   Homebound arranged: No  Financial Resources:  Medicaid, Private Insurance   Other Resources:  WIC, Food Stamps    Cultural/Religious Considerations Which May Impact Care:  None Reported  Strengths:  Ability to meet basic needs , Home prepared for child , Compliance with medical plan , Psychotropic Medications, Understanding of illness   Psychotropic Medications:  Zoloft([name] takes Zoloft PRN)      Pediatrician:       Pediatrician List:   [place]    [place]    [place]    [place]    [place]    [place]      Pediatrician Fax Number:    Risk Factors/Current Problems:  Mental Health Concerns , Transportation    Cognitive State:  Alert , Able to Concentrate , Linear Thinking ,  Insightful    Mood/Affect:  Bright , Comfortable , Happy , Relaxed , Interested    CSW Assessment: CSW met with [name] in room 145 to complete an assessement for NICU admission and hx of anxiety /depression.   When CSW arrived, [name] was resting in bed speaking with [name] on the phone. CSW explained CSW's role.  [name] asked [name] to complete the assessment while [name] was on speaker phone; CSW agreed. [name] was polite, easy to engage, and receptive to meeting with CSW.    CSW asked about [name]'s thoughts and feelings about NICU admission and [name] being a new mother. [name] shared feelings of frustration about knowing about infant's cleft pilate prenatally.  [name] reported attending routine prenatal appointments and medical providers being concerned about other things aside from infant's cleft pilate.  [name] also shared that [name] has not had an opportunity to visit with infant since transfer to NICU; [name] plans to visit with infant later today. [name] communicated that he is currently sick and will not be visiting with [name] or infant until feeling better. [name] and [name] communicated a wealth of support from [name]'s and [name]'s immediate and extended family.   CSW asked about    [name]'s MH hx and [name] acknowledged a hx of anxiety and depression for the past 4-5 years. [name] reported taking Prozac in the past and was given a prescription for Zoloft prenatally.  [name] reports taking Zolfot for the about 2 weeks and discontinue them.  [name] denied having any current signs or symptoms since discontinuing medication.  CSW provided education regarding the baby blues period vs. perinatal mood disorders, discussed treatment and gave resources for mental health follow up if concerns arise.  CSW recommends self-evaluation during the postpartum time period using the New Mom Checklist from Postpartum Progress and encouraged [name] to contact a medical professional if symptoms are noted at any time.  CSW assessed for safety and [name] denied SI,HI, and DV.  [name] did not  present with any acute signs or symptoms. CSW offered [name] resources for outpatient counseling and [name] declined.   SW discussed SSI in which baby qualifies for due to cleft pilate. [name] and [name] were nterested in applying and CSW explained process and steps. CSW will follow up with [name] in getting information about SSI.     CSW assessed for barriers with family visiting with infant in NICU after [name]'s discharge.  [name] shared that financially they may need assistance with gas due to [name] being out of work on maternity leave.  CSW encouraged family to reach out to CSW if a need arises.   CSW will continue to assessed family for psychosocial stressors while infant remains in NICU.   CSW Plan/Description:  Sudden Infant Death Syndrome (SIDS) Education, Supplemental Security Income (SSI) Information, Other Information/Referral to Community Resources, Psychosocial Support and Ongoing Assessment of Needs, Perinatal Mood and Anxiety Disorder (PMADs) Education, Other Patient/Family Education   [name], MSW, LCSW Clinical Social Work [phone]   [name], LCSW 07/03/2017, 3:03 PM 

## 2017-07-03 NOTE — Progress Notes (Signed)
CSW acknowledges consult.  CSW attempted to meet with [name], however [name] was asleep.  CSW will attempt to visit with [name] at a later time.   Mauricio Dahlen Boyd-Gilyard, MSW, LCSW Clinical Social Work [phone]  

## 2017-07-04 LAB — CBC
HCT: 23.9 % — ABNORMAL LOW (ref 36.0–46.0)
Hemoglobin: 8.1 g/dL — ABNORMAL LOW (ref 12.0–15.0)
MCH: 28.7 pg (ref 26.0–34.0)
MCHC: 33.9 g/dL (ref 30.0–36.0)
MCV: 84.8 fL (ref 78.0–100.0)
Platelets: 239 K/uL (ref 150–400)
RBC: 2.82 MIL/uL — ABNORMAL LOW (ref 3.87–5.11)
RDW: 13.9 % (ref 11.5–15.5)
WBC: 16.7 K/uL — ABNORMAL HIGH (ref 4.0–10.5)

## 2017-07-04 LAB — BIRTH TISSUE RECOVERY COLLECTION (PLACENTA DONATION)

## 2017-07-04 MED ORDER — SERTRALINE HCL 50 MG PO TABS: 50.0000 mg | ORAL_TABLET | Freq: Every day | ORAL | 3 refills | Status: DC

## 2017-07-04 MED ORDER — SERTRALINE HCL 50 MG PO TABS
50.0000 mg | ORAL_TABLET | Freq: Every day | ORAL | Status: DC
  Administered 2017-07-04: 50 mg via ORAL
  Filled 2017-07-04 (×2): qty 1

## 2017-07-04 MED ORDER — IBUPROFEN 600 MG PO TABS: 600.0000 mg | ORAL_TABLET | Freq: Four times a day (QID) | ORAL | 0 refills | Status: DC

## 2017-07-04 NOTE — Lactation Note (Signed)
This note was copied from a baby's chart. Lactation Consultation Note  Patient Name: Anne [name] [id number]'E Date: 07/04/2017 Reason for consult: Follow-up assessment  Mom has a Lansinoh pump from her insurance company & a Medela pump from a family member. Nora loaner option was discussed, but Mom declined at this time.   Size 21 flanges are appropriate for this Mom & she was observed to pump briefly to ensure good fit. Putting coconut oil in flanges discussed for lubrication, if Mom desires. Mom was shown how to assemble & use hand pump that was included in pump kit.   Mom noted to have widely-spaced breasts w/the R breast tubular shaped. Mom mentioned minimal breast changes with pregnancy. Minimal glandular tissue palpated. Mom gently told that we will not know for sure what her milk-producing capability will be until we have given her breasts a chance to experience lactogenesis II.   Matthias Hughs [facility] 07/04/2017, 11:46 AM

## 2017-07-04 NOTE — Discharge Summary (Signed)
Obstetric Discharge Summary Reason for Admission: onset of labor Prenatal Procedures: none Intrapartum Procedures: spontaneous vaginal delivery Postpartum Procedures: none Complications-Operative and Postpartum: 2 degree perineal laceration Hemoglobin  Date Value Ref Range Status  07/04/2017 8.1 (L) 12.0 - 15.0 g/dL Final   HCT  Date Value Ref Range Status  07/04/2017 23.9 (L) 36.0 - 46.0 % Final    Physical Exam:  General: alert, cooperative, appears stated age and no distress Lochia: appropriate Uterine Fundus: firm Incision: healing well DVT Evaluation: No evidence of DVT seen on physical exam.  Discharge Diagnoses: Term Pregnancy-delivered  Discharge Information: Date: 07/04/2017 Activity: pelvic rest Diet: routine Medications: Ibuprofen and sertraline 50mg  Condition: stable Instructions: refer to practice specific booklet Discharge to: home   Newborn Data: Live born female  Birth Weight: 8 lb 4.8 oz (3765 g) APGAR: 7, 9  Newborn Delivery   Birth date/time:  07/02/2017 22:54:00 Delivery type:  Vaginal, Spontaneous     Home with baby in NICU.  Clytee Heinrich C 07/04/2017, 8:58 AM

## 2017-07-04 NOTE — Lactation Note (Signed)
This note was copied from a baby's chart. Lactation Consultation Note  Patient Name: Anne [name] NWGNF'AToday's Date: 07/04/2017 Reason for consult: Follow-up assessment  Mom was recently started on Zoloft 50mg  (L2).   Consult Status Consult Status: PRN    Lurline HareRichey, Amarea Macdowell [facility] 07/04/2017, 11:55 AM

## 2017-07-04 NOTE — Progress Notes (Signed)
Post Partum Day 2 Subjective: up ad lib, voiding and tolerating PO  Objective: Blood pressure 106/67, pulse 71, temperature 98.1 F (36.7 [name]), temperature source Oral, resp. rate 16, height 5\' 1"  (1.549 m), weight 152 lb (68.9 kg), last menstrual period 09/09/2016, SpO2 99 %, unknown if currently breastfeeding.  Physical Exam:  General: alert, cooperative, appears stated age and no distress Lochia: appropriate Uterine Fundus: firm Incision: healing well DVT Evaluation: No evidence of DVT seen on physical exam.  Recent Labs    07/03/17 0515 07/04/17 0541  HGB 8.7* 8.1*  HCT 25.2* 23.9*    Assessment/Plan: Discharge home  Baby in NICU Patient and her mom think she's having mild PP depressive sxs No suicidal ideations or fear or hurting others Plan to start zoloft   LOS: 2 days   Anne [name] 07/04/2017, 8:55 AM

## 2017-07-10 ENCOUNTER — Inpatient Hospital Stay (HOSPITAL_COMMUNITY): Payer: Managed Care, Other (non HMO)

## 2017-11-15 LAB — OB RESULTS CONSOLE ABO/RH: RH Type: POSITIVE

## 2017-11-15 LAB — OB RESULTS CONSOLE GC/CHLAMYDIA
CHLAMYDIA, DNA PROBE: NEGATIVE
GC PROBE AMP, GENITAL: NEGATIVE

## 2017-11-15 LAB — OB RESULTS CONSOLE RUBELLA ANTIBODY, IGM: RUBELLA: IMMUNE

## 2017-11-15 LAB — OB RESULTS CONSOLE HEPATITIS B SURFACE ANTIGEN: Hepatitis B Surface Ag: NEGATIVE

## 2017-11-15 LAB — OB RESULTS CONSOLE HIV ANTIBODY (ROUTINE TESTING): HIV: NONREACTIVE

## 2017-11-15 LAB — OB RESULTS CONSOLE ANTIBODY SCREEN: ANTIBODY SCREEN: NEGATIVE

## 2017-11-15 LAB — OB RESULTS CONSOLE RPR: RPR: NONREACTIVE

## 2017-12-10 ENCOUNTER — Other Ambulatory Visit: Payer: Self-pay

## 2017-12-10 ENCOUNTER — Encounter (HOSPITAL_COMMUNITY): Payer: Self-pay | Admitting: *Deleted

## 2017-12-10 ENCOUNTER — Inpatient Hospital Stay (HOSPITAL_COMMUNITY)
Admission: AD | Admit: 2017-12-10 | Discharge: 2017-12-11 | Disposition: A | Discharge status: Home / Self Care | Payer: 59 | Source: Ambulatory Visit | Attending: Obstetrics and Gynecology | Admitting: Obstetrics and Gynecology

## 2017-12-10 DIAGNOSIS — Z87891 Personal history of nicotine dependence: Secondary | ICD-10-CM | POA: Insufficient documentation

## 2017-12-10 DIAGNOSIS — O26891 Other specified pregnancy related conditions, first trimester: Secondary | ICD-10-CM | POA: Insufficient documentation

## 2017-12-10 DIAGNOSIS — G44201 Tension-type headache, unspecified, intractable: Secondary | ICD-10-CM | POA: Insufficient documentation

## 2017-12-10 DIAGNOSIS — G44209 Tension-type headache, unspecified, not intractable: Secondary | ICD-10-CM

## 2017-12-10 DIAGNOSIS — Z3A12 12 weeks gestation of pregnancy: Secondary | ICD-10-CM | POA: Insufficient documentation
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
## 2017-12-11 DIAGNOSIS — Z3A12 12 weeks gestation of pregnancy: Secondary | ICD-10-CM | POA: Diagnosis not present

## 2017-12-11 DIAGNOSIS — G44201 Tension-type headache, unspecified, intractable: Secondary | ICD-10-CM | POA: Diagnosis not present

## 2017-12-11 DIAGNOSIS — G44209 Tension-type headache, unspecified, not intractable: Secondary | ICD-10-CM

## 2017-12-11 DIAGNOSIS — Z87891 Personal history of nicotine dependence: Secondary | ICD-10-CM | POA: Diagnosis not present

## 2017-12-11 DIAGNOSIS — O9989 Other specified diseases and conditions complicating pregnancy, childbirth and the puerperium: Secondary | ICD-10-CM | POA: Diagnosis not present

## 2017-12-11 DIAGNOSIS — M549 Dorsalgia, unspecified: Secondary | ICD-10-CM | POA: Diagnosis present

## 2017-12-11 DIAGNOSIS — O26891 Other specified pregnancy related conditions, first trimester: Secondary | ICD-10-CM | POA: Diagnosis not present

## 2017-12-11 MED ORDER — CYCLOBENZAPRINE HCL 10 MG PO TABS: 10.0000 mg | ORAL_TABLET | Freq: Three times a day (TID) | ORAL | 1 refills | Status: DC | PRN

## 2017-12-11 NOTE — Discharge Instructions (Signed)

## 2017-12-11 NOTE — MAU Note (Signed)
Pt given discharge instructions by Paulina FusiK Kooistra CNM
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
## 2018-04-06 ENCOUNTER — Inpatient Hospital Stay (HOSPITAL_COMMUNITY)
Admission: AD | Admit: 2018-04-06 | Discharge: 2018-04-06 | Disposition: A | Discharge status: Home / Self Care | Payer: 59 | Source: Ambulatory Visit | Attending: Obstetrics & Gynecology | Admitting: Obstetrics & Gynecology

## 2018-04-06 ENCOUNTER — Encounter (HOSPITAL_COMMUNITY): Payer: Self-pay | Admitting: *Deleted

## 2018-04-06 DIAGNOSIS — R109 Unspecified abdominal pain: Secondary | ICD-10-CM

## 2018-04-06 DIAGNOSIS — M549 Dorsalgia, unspecified: Secondary | ICD-10-CM

## 2018-04-06 DIAGNOSIS — O26893 Other specified pregnancy related conditions, third trimester: Secondary | ICD-10-CM | POA: Diagnosis not present

## 2018-04-06 DIAGNOSIS — R1012 Left upper quadrant pain: Secondary | ICD-10-CM | POA: Insufficient documentation

## 2018-04-06 DIAGNOSIS — F329 Major depressive disorder, single episode, unspecified: Secondary | ICD-10-CM | POA: Diagnosis not present

## 2018-04-06 DIAGNOSIS — Z3A29 29 weeks gestation of pregnancy: Secondary | ICD-10-CM | POA: Insufficient documentation

## 2018-04-06 DIAGNOSIS — O99891 Other specified diseases and conditions complicating pregnancy: Secondary | ICD-10-CM

## 2018-04-06 DIAGNOSIS — F419 Anxiety disorder, unspecified: Secondary | ICD-10-CM | POA: Diagnosis not present

## 2018-04-06 DIAGNOSIS — O26899 Other specified pregnancy related conditions, unspecified trimester: Secondary | ICD-10-CM

## 2018-04-06 DIAGNOSIS — J45909 Unspecified asthma, uncomplicated: Secondary | ICD-10-CM | POA: Diagnosis not present

## 2018-04-06 DIAGNOSIS — Z79899 Other long term (current) drug therapy: Secondary | ICD-10-CM | POA: Insufficient documentation

## 2018-04-06 DIAGNOSIS — O9989 Other specified diseases and conditions complicating pregnancy, childbirth and the puerperium: Secondary | ICD-10-CM

## 2018-04-06 DIAGNOSIS — O99513 Diseases of the respiratory system complicating pregnancy, third trimester: Secondary | ICD-10-CM | POA: Insufficient documentation

## 2018-04-06 DIAGNOSIS — Z818 Family history of other mental and behavioral disorders: Secondary | ICD-10-CM | POA: Insufficient documentation

## 2018-04-06 DIAGNOSIS — Z87891 Personal history of nicotine dependence: Secondary | ICD-10-CM | POA: Diagnosis not present

## 2018-04-06 DIAGNOSIS — O99343 Other mental disorders complicating pregnancy, third trimester: Secondary | ICD-10-CM | POA: Diagnosis not present

## 2018-04-06 LAB — URINALYSIS, ROUTINE W REFLEX MICROSCOPIC
Bilirubin Urine: NEGATIVE
GLUCOSE, UA: NEGATIVE mg/dL
HGB URINE DIPSTICK: NEGATIVE
Ketones, ur: NEGATIVE mg/dL
Nitrite: NEGATIVE
Protein, ur: NEGATIVE mg/dL
SPECIFIC GRAVITY, URINE: 1.011 (ref 1.005–1.030)
pH: 7 (ref 5.0–8.0)

## 2018-04-06 LAB — CBC
HCT: 28.3 % — ABNORMAL LOW (ref 36.0–46.0)
HEMOGLOBIN: 9 g/dL — AB (ref 12.0–15.0)
MCH: 28.1 pg (ref 26.0–34.0)
MCHC: 31.8 g/dL (ref 30.0–36.0)
MCV: 88.4 fL (ref 80.0–100.0)
Platelets: 267 10*3/uL (ref 150–400)
RBC: 3.2 MIL/uL — AB (ref 3.87–5.11)
RDW: 13.1 % (ref 11.5–15.5)
WBC: 8.1 10*3/uL (ref 4.0–10.5)
nRBC: 0 % (ref 0.0–0.2)

## 2018-04-06 LAB — COMPREHENSIVE METABOLIC PANEL
ALK PHOS: 67 U/L (ref 38–126)
ALT: 10 U/L (ref 0–44)
AST: 14 U/L — ABNORMAL LOW (ref 15–41)
Albumin: 3 g/dL — ABNORMAL LOW (ref 3.5–5.0)
Anion gap: 7 (ref 5–15)
BILIRUBIN TOTAL: 0.5 mg/dL (ref 0.3–1.2)
BUN: <5 mg/dL — ABNORMAL LOW (ref 6–20)
CALCIUM: 8.3 mg/dL — AB (ref 8.9–10.3)
CO2: 21 mmol/L — AB (ref 22–32)
Chloride: 106 mmol/L (ref 98–111)
Creatinine, Ser: 0.35 mg/dL — ABNORMAL LOW (ref 0.44–1.00)
GFR calc non Af Amer: >60 mL/min (ref >60)
Glucose, Bld: 91 mg/dL (ref 70–99)
Potassium: 3.2 mmol/L — ABNORMAL LOW (ref 3.5–5.1)
SODIUM: 134 mmol/L — AB (ref 135–145)
Total Protein: 6.5 g/dL (ref 6.5–8.1)

## 2018-04-06 MED ORDER — ALUM & MAG HYDROXIDE-SIMETH 200-200-20 MG/5ML PO SUSP
30.0000 mL | Freq: Once | ORAL | Status: AC
  Administered 2018-04-06: 30 mL via ORAL
  Filled 2018-04-06: qty 30

## 2018-04-06 NOTE — Discharge Instructions (Signed)

## 2018-04-06 NOTE — MAU Provider Note (Signed)
Chief Complaint:  Abdominal Pain   First Provider Initiated Contact with Patient 04/06/18 0208     HPI: Anne [name] is a 22 y.o. G2P1001 at 52w0dwho presents to maternity admissions reporting sudden onset of pain in left upper abdomen which wrapped to back.  Lasted a while then went away.  States was told to come in anyway.  Good fetal movement.  She reports good fetal movement, denies LOF, vaginal bleeding, vaginal itching/burning, urinary symptoms, h/a, dizziness, n/v, diarrhea, constipation or fever/chills.    Abdominal Pain  This is a new problem. The current episode started today. The onset quality is sudden. The problem occurs intermittently. The problem has been resolved. The pain is located in the LUQ. The pain is at a severity of 0/10. The patient is experiencing no pain. The quality of the pain is sharp and colicky. The abdominal pain radiates to the back. Pertinent negatives include no anorexia, constipation, diarrhea, dysuria, fever, frequency or myalgias. Nothing aggravates the pain. She has tried nothing for the symptoms.   RN Note: At 2245 started having a stabbing abdominal pain that wrapped around to her back.  Pain resided after she got up to come here but the pressure in her ribs lasted 1 hour.  Currently feeling some back soreness but the abdominal pain is gone.  No LOF/VB.  +FM.  No complications with this pregnancy  Past Medical History: Past Medical History:  Diagnosis Date  . Anxiety   . Asthma   . Depression   . Ovarian cyst     Past obstetric history: OB History  Gravida Para Term Preterm AB Living  2 1 1     1   SAB TAB Ectopic Multiple Live Births        0 1    # Outcome Date GA Lbr Len/2nd Weight Sex Delivery Anes PTL Lv  2 Current           1 Term 07/02/17 [redacted]w[redacted]d / 01:39 3765 g F Vag-Spont None  LIV    Past Surgical History: Past Surgical History:  Procedure Laterality Date  . NO PAST SURGERIES      Family History: Family History  Problem  Relation Age of Onset  . Depression Mother   . Anxiety disorder Mother   . Depression Maternal Grandmother   . Anxiety disorder Maternal Grandmother   . Depression Maternal Grandfather   . Anxiety disorder Maternal Grandfather     Social History: Social History   Tobacco Use  . Smoking status: Former Games developer  . Smokeless tobacco: Never Used  Substance Use Topics  . Alcohol use: No    Alcohol/week: 0.0 standard drinks  . Drug use: No    Allergies: No Known Allergies  Meds:  Medications Prior to Admission  Medication Sig Dispense Refill Last Dose  . calcium carbonate (TUMS - DOSED IN MG ELEMENTAL CALCIUM) 500 MG chewable tablet Chew 1-2 tablets by mouth daily as needed for indigestion or heartburn.   Past Week at Unknown time  . albuterol (PROVENTIL HFA;VENTOLIN HFA) 108 (90 Base) MCG/ACT inhaler Inhale 1-2 puffs into the lungs every 6 (six) hours as needed for wheezing or shortness of breath.   More than a month at Unknown time  . cyclobenzaprine (FLEXERIL) 10 MG tablet Take 1 tablet (10 mg total) by mouth every 8 (eight) hours as needed for muscle spasms. 30 tablet 1 More than a month at Unknown time  . diphenhydrAMINE (BENADRYL) 25 MG tablet Take 25 mg by mouth daily  as needed for allergies.   Past Month at Unknown time  . sertraline (ZOLOFT) 50 MG tablet Take 1 tablet (50 mg total) by mouth daily. 30 tablet 3 More than a month at Unknown time    I have reviewed patient's Past Medical Hx, Surgical Hx, Family Hx, Social Hx, medications and allergies.   ROS:  Review of Systems  Constitutional: Negative for fever.  Gastrointestinal: Positive for abdominal pain. Negative for anorexia, constipation and diarrhea.  Genitourinary: Negative for dysuria and frequency.  Musculoskeletal: Negative for myalgias.   Other systems negative  Physical Exam   Patient Vitals for the past 24 hrs:  BP Temp Pulse Resp SpO2 Height Weight  04/06/18 0100 112/67 98.3 F (36.8 C) 86 19 97 % 5\' 1"   (1.549 m) 65.3 kg   Constitutional: Well-developed, well-nourished female in no acute distress.  Cardiovascular: normal rate and rhythm Respiratory: normal effort, clear to auscultation bilaterally GI: Abd soft, non-tender, gravid appropriate for gestational age.   No rebound or guarding. MS: Extremities nontender, no edema, normal ROM Neurologic: Alert and oriented x 4.  GU: Neg CVAT.  PELVIC EXAM:  cervix not examined, because she had no lower pain and no contractions   FHT:  Baseline 130 , moderate variability, accelerations present, no decelerations Contractions:  Rare   Labs: Results for orders placed or performed during the hospital encounter of 04/06/18 (from the past 24 hour(s))  Urinalysis, Routine w reflex microscopic     Status: Abnormal   Collection Time: 04/06/18  1:03 AM  Result Value Ref Range   Color, Urine YELLOW YELLOW   APPearance CLEAR CLEAR   Specific Gravity, Urine 1.011 1.005 - 1.030   pH 7.0 5.0 - 8.0   Glucose, UA NEGATIVE NEGATIVE mg/dL   Hgb urine dipstick NEGATIVE NEGATIVE   Bilirubin Urine NEGATIVE NEGATIVE   Ketones, ur NEGATIVE NEGATIVE mg/dL   Protein, ur NEGATIVE NEGATIVE mg/dL   Nitrite NEGATIVE NEGATIVE   Leukocytes, UA TRACE (A) NEGATIVE   WBC, UA 0-5 0 - 5 WBC/hpf   Bacteria, UA RARE (A) NONE SEEN   Squamous Epithelial / LPF 0-5 0 - 5   Mucus PRESENT   CBC     Status: Abnormal   Collection Time: 04/06/18  2:26 AM  Result Value Ref Range   WBC 8.1 4.0 - 10.5 K/uL   RBC 3.20 (L) 3.87 - 5.11 MIL/uL   Hemoglobin 9.0 (L) 12.0 - 15.0 g/dL   HCT 16.128.3 (L) 09.636.0 - 04.546.0 %   MCV 88.4 80.0 - 100.0 fL   MCH 28.1 26.0 - 34.0 pg   MCHC 31.8 30.0 - 36.0 g/dL   RDW 40.913.1 81.111.5 - 91.415.5 %   Platelets 267 150 - 400 K/uL   nRBC 0.0 0.0 - 0.2 %  Comprehensive metabolic panel     Status: Abnormal   Collection Time: 04/06/18  2:26 AM  Result Value Ref Range   Sodium 134 (L) 135 - 145 mmol/L   Potassium 3.2 (L) 3.5 - 5.1 mmol/L   Chloride 106 98 - 111  mmol/L   CO2 21 (L) 22 - 32 mmol/L   Glucose, Bld 91 70 - 99 mg/dL   BUN <5 (L) 6 - 20 mg/dL   Creatinine, Ser 7.820.35 (L) 0.44 - 1.00 mg/dL   Calcium 8.3 (L) 8.9 - 10.3 mg/dL   Total Protein 6.5 6.5 - 8.1 g/dL   Albumin 3.0 (L) 3.5 - 5.0 g/dL   AST 14 (L) 15 - 41 U/L  ALT 10 0 - 44 U/L   Alkaline Phosphatase 67 38 - 126 U/L   Total Bilirubin 0.5 0.3 - 1.2 mg/dL   GFR calc non Af Amer >60 >60 mL/min   GFR calc Af Amer >60 >60 mL/min   Anion gap 7 5 - 15    Imaging:  No results found.  MAU Course/MDM: I have ordered labs and reviewed results. Urine is dilute and clear.  No leukocytosis.  Chemistries normal except for mild hypokalemia NST reviewed and is reactive, Category I .  Treatments in MAU included EFM.    Assessment: Single intrauterine pregnancy at [redacted]w[redacted]d Episodic upper abdominal pain, suspect Gastrointestinal, no evidence of acute abdominal process  Plan: Discharge home PO hydration Balanced diet, low fat Preterm Labor precautions and fetal kick counts Follow up in Office for prenatal visits and recheck of status  Encouraged to return here or to other Urgent Care/ED if she develops worsening of symptoms, increase in pain, fever, or other concerning symptoms.   Pt stable at time of discharge.  Wynelle Bourgeois CNM, MSN Certified Nurse-Midwife 04/06/2018 2:08 AM

## 2018-04-06 NOTE — MAU Note (Signed)
Pt denies any pain or problem at all. Says call a nurse told her to come in.

## 2018-04-06 NOTE — MAU Note (Signed)
At 2245 started having a stabbing abdominal pain that wrapped around to her back.  Pain resided after she got up to come here but the pressure in her ribs lasted 1 hour.  Currently feeling some back soreness but the abdominal pain is gone.  No LOF/VB.  +FM.  No complications with this pregnancy.

## 2018-05-16 NOTE — L&D Delivery Note (Signed)
Delivery Note At 2:44 PM a viable female was delivered via Vaginal, Spontaneous (Presentation: ROA).  APGAR: 8, 9; weight pending.   Placenta status: S, I. 3V Cord with the following complications: loose nuchal x 1; reduced.  Cord pH: n/a  Anesthesia:  1% lidocaine Episiotomy: None Lacerations: 1st degree;Perineal Suture Repair: 3.0 vicryl rapide Est. Blood Loss (mL):  250  Mom to postpartum.  Baby to Couplet care / Skin to Skin.  Mitchel Honour 06/26/2018, 3:06 PM

## 2018-05-29 ENCOUNTER — Other Ambulatory Visit (HOSPITAL_COMMUNITY): Payer: Self-pay | Admitting: *Deleted

## 2018-05-29 DIAGNOSIS — E7421 Galactosemia: Secondary | ICD-10-CM | POA: Insufficient documentation

## 2018-05-30 ENCOUNTER — Ambulatory Visit (HOSPITAL_COMMUNITY)
Admission: RE | Admit: 2018-05-30 | Discharge: 2018-05-30 | Disposition: A | Discharge status: Home / Self Care | Payer: Medicaid Other | Source: Ambulatory Visit | Attending: Obstetrics and Gynecology | Admitting: Obstetrics and Gynecology

## 2018-05-30 DIAGNOSIS — D509 Iron deficiency anemia, unspecified: Secondary | ICD-10-CM | POA: Diagnosis present

## 2018-05-30 MED ORDER — SODIUM CHLORIDE 0.9 % IV SOLN
510.0000 mg | INTRAVENOUS | Status: DC
  Administered 2018-05-30: 14:00:00 510 mg via INTRAVENOUS
  Filled 2018-05-30: qty 17

## 2018-05-30 NOTE — Discharge Instructions (Signed)

## 2018-06-04 ENCOUNTER — Ambulatory Visit (HOSPITAL_COMMUNITY)
Admission: RE | Admit: 2018-06-04 | Discharge: 2018-06-04 | Disposition: A | Discharge status: Home / Self Care | Payer: Medicaid Other | Source: Ambulatory Visit | Attending: Obstetrics and Gynecology | Admitting: Obstetrics and Gynecology

## 2018-06-04 DIAGNOSIS — O99019 Anemia complicating pregnancy, unspecified trimester: Secondary | ICD-10-CM | POA: Diagnosis not present

## 2018-06-04 DIAGNOSIS — D509 Iron deficiency anemia, unspecified: Secondary | ICD-10-CM | POA: Insufficient documentation

## 2018-06-04 MED ORDER — SODIUM CHLORIDE 0.9 % IV SOLN
510.0000 mg | INTRAVENOUS | Status: AC
  Administered 2018-06-04: 510 mg via INTRAVENOUS
  Filled 2018-06-04: qty 510

## 2018-06-21 ENCOUNTER — Telehealth (HOSPITAL_COMMUNITY): Payer: Self-pay | Admitting: *Deleted

## 2018-06-21 ENCOUNTER — Encounter (HOSPITAL_COMMUNITY): Payer: Self-pay | Admitting: *Deleted

## 2018-06-21 NOTE — Telephone Encounter (Signed)
Preadmission screen  

## 2018-06-22 ENCOUNTER — Encounter (HOSPITAL_COMMUNITY): Payer: Self-pay | Admitting: *Deleted

## 2018-06-24 ENCOUNTER — Inpatient Hospital Stay (HOSPITAL_COMMUNITY): Admit: 2018-06-24 | Payer: Self-pay

## 2018-06-26 ENCOUNTER — Encounter (HOSPITAL_COMMUNITY): Payer: Self-pay

## 2018-06-26 ENCOUNTER — Other Ambulatory Visit: Payer: Self-pay

## 2018-06-26 ENCOUNTER — Inpatient Hospital Stay (HOSPITAL_COMMUNITY)
Admission: RE | Admit: 2018-06-26 | Discharge: 2018-06-28 | DRG: 807 | Disposition: A | Discharge status: Home / Self Care | Payer: Medicaid Other | Attending: Obstetrics & Gynecology | Admitting: Obstetrics & Gynecology

## 2018-06-26 DIAGNOSIS — Z3A40 40 weeks gestation of pregnancy: Secondary | ICD-10-CM

## 2018-06-26 DIAGNOSIS — J45909 Unspecified asthma, uncomplicated: Secondary | ICD-10-CM | POA: Diagnosis present

## 2018-06-26 DIAGNOSIS — Z3490 Encounter for supervision of normal pregnancy, unspecified, unspecified trimester: Secondary | ICD-10-CM

## 2018-06-26 DIAGNOSIS — O9952 Diseases of the respiratory system complicating childbirth: Secondary | ICD-10-CM | POA: Diagnosis present

## 2018-06-26 DIAGNOSIS — Z87891 Personal history of nicotine dependence: Secondary | ICD-10-CM | POA: Diagnosis not present

## 2018-06-26 DIAGNOSIS — Z3483 Encounter for supervision of other normal pregnancy, third trimester: Secondary | ICD-10-CM | POA: Diagnosis present

## 2018-06-26 LAB — CBC
HCT: 36.4 % (ref 36.0–46.0)
Hemoglobin: 11.4 g/dL — ABNORMAL LOW (ref 12.0–15.0)
MCH: 28 pg (ref 26.0–34.0)
MCHC: 31.3 g/dL (ref 30.0–36.0)
MCV: 89.4 fL (ref 80.0–100.0)
Platelets: 203 10*3/uL (ref 150–400)
RBC: 4.07 MIL/uL (ref 3.87–5.11)
RDW: 21 % — ABNORMAL HIGH (ref 11.5–15.5)
WBC: 7.7 10*3/uL (ref 4.0–10.5)
nRBC: 0 % (ref 0.0–0.2)

## 2018-06-26 LAB — TYPE AND SCREEN
ABO/RH(D): O POS
Antibody Screen: NEGATIVE

## 2018-06-26 LAB — RPR: RPR Ser Ql: NONREACTIVE

## 2018-06-26 MED ORDER — PHENYLEPHRINE 40 MCG/ML (10ML) SYRINGE FOR IV PUSH (FOR BLOOD PRESSURE SUPPORT)
80.0000 ug | PREFILLED_SYRINGE | INTRAVENOUS | Status: DC | PRN
  Filled 2018-06-26: qty 10

## 2018-06-26 MED ORDER — OXYCODONE-ACETAMINOPHEN 5-325 MG PO TABS: 1.0000 | ORAL_TABLET | ORAL | Status: DC | PRN

## 2018-06-26 MED ORDER — OXYTOCIN 40 UNITS IN NORMAL SALINE INFUSION - SIMPLE MED: 2.5000 [IU]/h | INTRAVENOUS | Status: DC

## 2018-06-26 MED ORDER — DIPHENHYDRAMINE HCL 25 MG PO CAPS: 25.0000 mg | ORAL_CAPSULE | Freq: Four times a day (QID) | ORAL | Status: DC | PRN

## 2018-06-26 MED ORDER — LACTATED RINGERS IV SOLN: 500.0000 mL | Freq: Once | INTRAVENOUS | Status: DC

## 2018-06-26 MED ORDER — LACTATED RINGERS IV SOLN: 500.0000 mL | INTRAVENOUS | Status: DC | PRN

## 2018-06-26 MED ORDER — OXYCODONE-ACETAMINOPHEN 5-325 MG PO TABS
2.0000 | ORAL_TABLET | ORAL | Status: DC | PRN
  Administered 2018-06-26: 2 via ORAL
  Filled 2018-06-26: qty 2

## 2018-06-26 MED ORDER — SOD CITRATE-CITRIC ACID 500-334 MG/5ML PO SOLN: 30.0000 mL | ORAL | Status: DC | PRN

## 2018-06-26 MED ORDER — COCONUT OIL OIL: 1.0000 "application " | TOPICAL_OIL | Status: DC | PRN

## 2018-06-26 MED ORDER — IBUPROFEN 600 MG PO TABS
600.0000 mg | ORAL_TABLET | Freq: Four times a day (QID) | ORAL | Status: DC
  Administered 2018-06-26 – 2018-06-28 (×6): 600 mg via ORAL
  Filled 2018-06-26 (×7): qty 1

## 2018-06-26 MED ORDER — LIDOCAINE HCL (PF) 1 % IJ SOLN
30.0000 mL | INTRAMUSCULAR | Status: AC | PRN
  Administered 2018-06-26: 30 mL via SUBCUTANEOUS
  Filled 2018-06-26: qty 30

## 2018-06-26 MED ORDER — ONDANSETRON HCL 4 MG/2ML IJ SOLN
4.0000 mg | INTRAMUSCULAR | Status: DC | PRN
  Administered 2018-06-27: 4 mg via INTRAVENOUS
  Filled 2018-06-26: qty 2

## 2018-06-26 MED ORDER — ONDANSETRON HCL 4 MG/2ML IJ SOLN: 4.0000 mg | Freq: Four times a day (QID) | INTRAMUSCULAR | Status: DC | PRN

## 2018-06-26 MED ORDER — DIBUCAINE 1 % RE OINT: 1.0000 "application " | TOPICAL_OINTMENT | RECTAL | Status: DC | PRN

## 2018-06-26 MED ORDER — BENZOCAINE-MENTHOL 20-0.5 % EX AERO
1.0000 "application " | INHALATION_SPRAY | CUTANEOUS | Status: DC | PRN
  Administered 2018-06-26: 1 via TOPICAL
  Filled 2018-06-26: qty 56

## 2018-06-26 MED ORDER — ACETAMINOPHEN 325 MG PO TABS: 650.0000 mg | ORAL_TABLET | ORAL | Status: DC | PRN

## 2018-06-26 MED ORDER — TETANUS-DIPHTH-ACELL PERTUSSIS 5-2.5-18.5 LF-MCG/0.5 IM SUSP: 0.5000 mL | Freq: Once | INTRAMUSCULAR | Status: DC

## 2018-06-26 MED ORDER — ONDANSETRON HCL 4 MG PO TABS: 4.0000 mg | ORAL_TABLET | ORAL | Status: DC | PRN

## 2018-06-26 MED ORDER — EPHEDRINE 5 MG/ML INJ
10.0000 mg | INTRAVENOUS | Status: DC | PRN
  Filled 2018-06-26: qty 2

## 2018-06-26 MED ORDER — ACETAMINOPHEN 325 MG PO TABS
650.0000 mg | ORAL_TABLET | ORAL | Status: DC | PRN
  Administered 2018-06-27: 650 mg via ORAL
  Filled 2018-06-26: qty 2

## 2018-06-26 MED ORDER — ZOLPIDEM TARTRATE 5 MG PO TABS: 5.0000 mg | ORAL_TABLET | Freq: Every evening | ORAL | Status: DC | PRN

## 2018-06-26 MED ORDER — FENTANYL CITRATE (PF) 100 MCG/2ML IJ SOLN: 50.0000 ug | INTRAMUSCULAR | Status: DC | PRN

## 2018-06-26 MED ORDER — OXYCODONE-ACETAMINOPHEN 5-325 MG PO TABS: 2.0000 | ORAL_TABLET | ORAL | Status: DC | PRN

## 2018-06-26 MED ORDER — TERBUTALINE SULFATE 1 MG/ML IJ SOLN
0.2500 mg | Freq: Once | INTRAMUSCULAR | Status: DC | PRN
  Filled 2018-06-26: qty 1

## 2018-06-26 MED ORDER — FENTANYL 2.5 MCG/ML BUPIVACAINE 1/10 % EPIDURAL INFUSION (WH - ANES): 14.0000 mL/h | INTRAMUSCULAR | Status: DC | PRN

## 2018-06-26 MED ORDER — SIMETHICONE 80 MG PO CHEW: 80.0000 mg | CHEWABLE_TABLET | ORAL | Status: DC | PRN

## 2018-06-26 MED ORDER — WITCH HAZEL-GLYCERIN EX PADS: 1.0000 "application " | MEDICATED_PAD | CUTANEOUS | Status: DC | PRN

## 2018-06-26 MED ORDER — SENNOSIDES-DOCUSATE SODIUM 8.6-50 MG PO TABS
2.0000 | ORAL_TABLET | ORAL | Status: DC
  Administered 2018-06-26 – 2018-06-27 (×2): 2 via ORAL
  Filled 2018-06-26 (×2): qty 2

## 2018-06-26 MED ORDER — DIPHENHYDRAMINE HCL 50 MG/ML IJ SOLN: 12.5000 mg | INTRAMUSCULAR | Status: DC | PRN

## 2018-06-26 MED ORDER — FLEET ENEMA 7-19 GM/118ML RE ENEM: 1.0000 | ENEMA | RECTAL | Status: DC | PRN

## 2018-06-26 MED ORDER — OXYTOCIN 40 UNITS IN NORMAL SALINE INFUSION - SIMPLE MED
1.0000 m[IU]/min | INTRAVENOUS | Status: DC
  Administered 2018-06-26: 2 m[IU]/min via INTRAVENOUS
  Filled 2018-06-26: qty 1000

## 2018-06-26 MED ORDER — OXYTOCIN BOLUS FROM INFUSION
500.0000 mL | Freq: Once | INTRAVENOUS | Status: AC
  Administered 2018-06-26: 500 mL via INTRAVENOUS

## 2018-06-26 MED ORDER — LACTATED RINGERS IV SOLN
INTRAVENOUS | Status: DC
  Administered 2018-06-26: 125 mL/h via INTRAVENOUS

## 2018-06-26 MED ORDER — PRENATAL MULTIVITAMIN CH
1.0000 | ORAL_TABLET | Freq: Every day | ORAL | Status: DC
  Administered 2018-06-27: 1 via ORAL
  Filled 2018-06-26: qty 1

## 2018-06-26 MED ORDER — ALBUTEROL SULFATE (2.5 MG/3ML) 0.083% IN NEBU: 3.0000 mL | INHALATION_SOLUTION | Freq: Four times a day (QID) | RESPIRATORY_TRACT | Status: DC | PRN

## 2018-06-26 NOTE — Anesthesia Pain Management Evaluation Note (Signed)
  CRNA Pain Management Visit Note  Patient: Anne [name], 23 y.o., female  "Hello I am a member of the anesthesia team at [facility]. We have an anesthesia team available at all times to provide care throughout the hospital, including epidural management and anesthesia for C-section. I don't know your plan for the delivery whether it a natural birth, water birth, IV sedation, nitrous supplementation, doula or epidural, but we want to meet your pain goals."   1.Was your pain managed to your expectations on prior hospitalizations?   Yes   2.What is your expectation for pain management during this hospitalization?     Nitrous Oxide  3.How can we help you reach that goal?   Record the patient's initial score and the patient's pain goal.   Pain: 3  Pain Goal: 5 [facility] wants you to be able to say your pain was always managed very well.  Laban Emperor 06/26/2018

## 2018-06-26 NOTE — H&P (Signed)
Anne [name] is a 23 y.o. female presenting for elective IOL at term.  Patient has asthma which has been stable this pregnancy; rescue inhaler prn.  Her previously delivered baby has Otilio Jefferson which was diagnosed antenatally.  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  Patient is s/p genetics counseling for both of these conditions.  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  She has had stable mood and has not taken meds during this pregnancy.  GBS negative.     OB History    Gravida  2   Para  1   Term  1   Preterm      AB      Living  1     SAB      TAB      Ectopic      Multiple  0   Live Births  1          Past Medical History:  Diagnosis Date  . Anxiety   . Asthma   . Depression   . History of cystitis   . Ovarian cyst    Past Surgical History:  Procedure Laterality Date  . NO PAST SURGERIES     Family History: family history includes Anxiety disorder in her maternal grandfather, maternal grandmother, and mother; Depression in her maternal grandfather, maternal grandmother, and mother; Heart disease in her maternal grandfather and paternal grandfather; Hypertension in her maternal grandfather, maternal grandmother, and paternal grandfather; Melanoma in her paternal grandmother; Stroke in her maternal grandfather and maternal grandmother; Thyroid disease in her maternal grandmother. Social History:  reports that she has quit smoking. She has never used smokeless tobacco. She reports that she does not drink alcohol or use drugs.     Maternal Diabetes: No Genetic Screening: Normal Maternal Ultrasounds/Referrals: Normal Fetal Ultrasounds or other Referrals:  None Maternal Substance Abuse:  No Significant Maternal Medications:  None Significant Maternal Lab Results:  Lab values include: Group B Strep negative Other Comments:  None  ROS Maternal Medical History:  Fetal activity: Perceived fetal activity is  normal.   Last perceived fetal movement was within the past hour.    Prenatal complications: no prenatal complications Prenatal Complications - Diabetes: none.      unknown if currently breastfeeding. Maternal Exam:  Uterine Assessment: Contraction strength is mild.  Contraction frequency is rare.   Abdomen: Patient reports no abdominal tenderness. Fundal height is c/w dates.   Estimated fetal weight is 7#8.       Physical Exam  Constitutional: She is oriented to person, place, and time. She appears well-developed and well-nourished.  GI: Soft. There is no rebound and no guarding.  Neurological: She is alert and oriented to person, place, and time.  Skin: Skin is warm and dry.  Psychiatric: She has a normal mood and affect. Her behavior is normal.    Prenatal labs: ABO, Rh: O/Positive/-- (07/03 0000) Antibody: Negative (07/03 0000) Rubella: Immune (07/03 0000) RPR: Nonreactive (07/03 0000)  HBsAg: Negative (07/03 0000)  HIV: Non-reactive (07/03 0000)  GBS:   Negative  Assessment/Plan: 22yo G2P1001 at [redacted]w[redacted]d for IOL -Start pitocin -AROM when able -Patient plans nitrous for pain control -Anticipate NSVD   Mitchel Honour 06/26/2018, 7:37 AM

## 2018-06-26 NOTE — Progress Notes (Signed)
SVE 3/50/-1, AROM clear Continue pitocin FHT Cat I  Mitchel HonourMegan Sayid Moll, DO

## 2018-06-26 NOTE — Lactation Note (Signed)
This note was copied from a baby's chart. Lactation Consultation Note  Patient Name: Anne [name] MEQAS'T Date: 06/26/2018 Reason for consult: Initial assessment;Other (Comment);Term(DAT (+))  3 hours old FT female who is being exclusively BF by his mother, she's a P2 but not very experienced BF. Mom's first baby had a cleft palate and stayed in the NICU, she exclusively pumped and bottle for a month. She participated in the [facility] program at the [facility] and she's already familiar with hand expression. When [facility] assisted with hand expression, noticed that mom had wide spaced breast, she also reported minimal breast changes during the pregnancy.   When asked about her BF history mom voiced to [facility] that a previous LC told her that she may not be able to produce a full supply for her first baby; especially with the right breast. However noticed that mom's right breast is slightly larger than the left one and LC got droplets of colostrum easily out of the right one in comparison to the left one. LC offered to set up a pump, especially since baby is DAT (+) and explained to mom the importance of pumping early on in case we need to supplement her baby. Mom voiced she's not ready to start pumping right now but that she'll let her RN tomorrow when she's ready.   Offered assistance with latch but mom politely declined, she stated that baby already fed, documented in Flowsheets. Baby started crying soon after but dad picked him up, mom just kept filling up her hospital paperwork. Asked mom to call for assistance when needed. Discussed feeding cues and normal newborn behavior.  Feeding plan:  1. Encouraged mom to feed baby STS 8-12 times/24 hours or sooner if feeding cues are present 2. Hand expression and spoon feeding was also encouraged 3. Mom will let her RN know whenever she's ready to start pumping to be set up with a DEBP  BF brochure, BF resources and feeding diary were reviewed. Mom reported all questions and  concerns were answered, she's aware of LC services and will call PRN.  Maternal Data Formula Feeding for Exclusion: No Has patient been taught Hand Expression?: Yes Does the patient have breastfeeding experience prior to this delivery?: Yes  Feeding Feeding Type: Breast Fed  LATCH Score Latch: Grasps breast easily, tongue down, lips flanged, rhythmical sucking.  Audible Swallowing: A few with stimulation  Type of Nipple: Everted at rest and after stimulation(small nipples)  Comfort (Breast/Nipple): Soft / non-tender  Hold (Positioning): Assistance needed to correctly position infant at breast and maintain latch.(need to remind mom to guide baby's head)  LATCH Score: 8  Interventions Interventions: Breast feeding basics reviewed;Hand express;Breast compression;Breast massage  Lactation Tools Discussed/Used WIC Program: Yes   Consult Status Consult Status: Follow-up Date: 06/27/18 Follow-up type: In-patient    Anne [name] 06/26/2018, 6:12 PM

## 2018-06-27 LAB — CBC
HCT: 29.8 % — ABNORMAL LOW (ref 36.0–46.0)
Hemoglobin: 9.4 g/dL — ABNORMAL LOW (ref 12.0–15.0)
MCH: 28.1 pg (ref 26.0–34.0)
MCHC: 31.5 g/dL (ref 30.0–36.0)
MCV: 89.2 fL (ref 80.0–100.0)
NRBC: 0 % (ref 0.0–0.2)
Platelets: 172 10*3/uL (ref 150–400)
RBC: 3.34 MIL/uL — ABNORMAL LOW (ref 3.87–5.11)
RDW: 21 % — AB (ref 11.5–15.5)
WBC: 10 10*3/uL (ref 4.0–10.5)

## 2018-06-27 NOTE — Progress Notes (Signed)
Patient doing well No complaints. BP 102/60 (BP Location: Left Arm)   Pulse 84   Temp 98.7 F (37.1 C) (Oral)   Resp 16   Ht 5\' 1"  (1.549 m)   Wt 69.4 kg   SpO2 98%   Breastfeeding Unknown   BMI 28.89 kg/m  Results for orders placed or performed during the hospital encounter of 06/26/18 (from the past 24 hour(s))  CBC     Status: Abnormal   Collection Time: 06/27/18  5:30 AM  Result Value Ref Range   WBC 10.0 4.0 - 10.5 K/uL   RBC 3.34 (L) 3.87 - 5.11 MIL/uL   Hemoglobin 9.4 (L) 12.0 - 15.0 g/dL   HCT 06.2 (L) 37.6 - 28.3 %   MCV 89.2 80.0 - 100.0 fL   MCH 28.1 26.0 - 34.0 pg   MCHC 31.5 30.0 - 36.0 g/dL   RDW 15.1 (H) 76.1 - 60.7 %   Platelets 172 150 - 400 K/uL   nRBC 0.0 0.0 - 0.2 %   Abdomen soft and non tender Lochia WNL  PPD # 1  Doing well Routine care

## 2018-06-27 NOTE — Progress Notes (Signed)
CSW received consult for hx of Anxiety and Postpartum Depression.  CSW met with [name] to offer support and complete assessment.    CSW met with [name] at bedside to discuss consult for history of anxiety and PPD, [name] present. CSW asked [name] to leave during assessment with [name]'s permission, [name] left voluntarily. CSW introduced self and explained reason for consult. [name] was welcoming, pleasant and engaged during assessment. CSW and [name] discussed [name]'s mental health history. [name] reported that she was diagnosed with depression at 23 years old, anxiety at 23 years old and PPD after having her first child. [name] reported that she was taking Zoloft in the past and it was not helpful. [name] reported that she stopped taking Zoloft in May 2019 and is not currently taking any medication. [name] endorsed some anxiety and depressive symptoms as recent as a couple weeks ago. [name] reported multiple stressors recently and trying to adjust. CSW normalized [name]'s feelings around stressors and adjusting to change. CSW inquired about [name]'s coping skills, [name] reported none and that she is usually able to self soothe when having symptoms of anxiety and depression noting, "I'm okay if I don't freak myself out". CSW and [name] processed [name]'s mental health symptoms. CSW asked [name] about her PPD. [name] reported that she had PPD due to her daughter being in the NICU and having 4 surgeries in 9 months. [name] reported worry and fear about her daughter's health and now the new baby's health. CSW normalized [name]'s feelings of fear about her children's health as a concerned/caring parent and given her experience with the NICU. [name] reported that her PPD symptoms subsided once her daughter was discharged home from the NICU. CSW asked [name] how she was feeling, [name] reported that she was feeling good and tired. [name] presented calm and verbalized insight about her mental health history. [name] did not demonstrate any acute mental health signs/symptoms. CSW assessed for safety,  [name] denied SI, HI and domestic violence. CSW informed [name] that she may be more prone to PPD due to her mental health history and provided education.   CSW provided education regarding the baby blues period vs. perinatal mood disorders, discussed treatment and gave resources for mental health follow up if concerns arise.  CSW recommends self-evaluation during the postpartum time period using the New Mom Checklist from Postpartum Progress and encouraged [name] to contact a medical professional if symptoms are noted at any time.      CSW identifies no further need for intervention and no barriers to discharge at this time.  Arvel Oquinn, LCSW  Clinical Social Worker [facility] Cell#: [phone]  

## 2018-06-27 NOTE — Lactation Note (Signed)
This note was copied from a baby's chart. Lactation Consultation Note  Patient Name: Anne [name] WCHEN'I Date: 06/27/2018 Reason for consult: Follow-up assessment;Term  P2 mother whose infant is now 48 hours old.  Mother pumped and bottle fed for 1 month with her first child who has special needs and was in the NICU.  Mother has wide spaced breasts, smaller amount of breast tissue and small nipples.   She feels like breast feeding has gone well so far and she had no questions/concerns related to breast feeding. Mother denies pain with latching.  Offered to initiate the DEBP with her and she accepted.  Pump parts, assembly, disassembly and cleaning reviewed.  #24 flange size is appropriate at this time.  Reminded mother to offer breast first and pump after.  Mother will continue to feed 8-12 times/24 hours or sooner if baby shows feeding cues.  Colostrum container provided and encouraged hand expression before/after feeding to help increase milk supply.  Milk storage times reviewed and finger feeding demonstrated.    Mother will call for latch assistance as needed.  Father present.     Maternal Data Formula Feeding for Exclusion: No Has patient been taught Hand Expression?: Yes Does the patient have breastfeeding experience prior to this delivery?: Yes  Feeding Feeding Type: Breast Fed  LATCH Score                   Interventions    Lactation Tools Discussed/Used WIC Program: Yes   Consult Status Consult Status: Follow-up Date: 06/28/18 Follow-up type: In-patient    Anne [name] 06/27/2018, 3:24 PM

## 2018-06-28 NOTE — Discharge Summary (Signed)
Obstetric Discharge Summary Reason for Admission: induction of labor Prenatal Procedures: none Intrapartum Procedures: spontaneous vaginal delivery Postpartum Procedures: none Complications-Operative and Postpartum: 1st degree perineal laceration Hemoglobin  Date Value Ref Range Status  06/27/2018 9.4 (L) 12.0 - 15.0 g/dL Final   HCT  Date Value Ref Range Status  06/27/2018 29.8 (L) 36.0 - 46.0 % Final    Physical Exam:  General: alert, cooperative and appears stated age 23: appropriate Uterine Fundus: firm Incision: healing well, no significant drainage, no dehiscence, no significant erythema DVT Evaluation: No evidence of DVT seen on physical exam. Negative Homan's sign. No cords or calf tenderness. No significant calf/ankle edema.  Discharge Diagnoses: Term Pregnancy-delivered  Discharge Information: Date: 06/28/2018 Activity: pelvic rest Diet: routine Medications: PNV  Condition: stable Instructions: refer to practice specific booklet Discharge to: home   Newborn Data: Live born female  Birth Weight: 8 lb 5.3 oz (3780 g) APGAR: 8, 9  Newborn Delivery   Birth date/time:  06/26/2018 14:44:00 Delivery type:  Vaginal, Spontaneous     Home with mother.  Ranae Pila 06/28/2018, 7:58 AM

## 2018-06-28 NOTE — Lactation Note (Signed)
This note was copied from a baby's chart. Lactation Consultation Note  Patient Name: Anne [name] IRWER'X Date: 06/28/2018 Reason for consult: Follow-up assessment Mom reports baby is feeding well with nipple shield.  Assisted with positioning baby in football hold.  After a few attempts baby latched well with 16 mm nipple shield.  Active suck/swallows noted.  Discussed milk coming to volume and the prevention and treatment of engorgement.  Mom is post pumping and has a pump at home.  Instructed to post pump 4-6 times per day when using the shield.  Mom denies questions.  Lactation outpatient services and support reviewed and encouraged prn.  Maternal Data    Feeding Feeding Type: Breast Fed  LATCH Score Latch: Grasps breast easily, tongue down, lips flanged, rhythmical sucking.(with nipple shield)  Audible Swallowing: A few with stimulation  Type of Nipple: Everted at rest and after stimulation  Comfort (Breast/Nipple): Soft / non-tender  Hold (Positioning): Assistance needed to correctly position infant at breast and maintain latch.  LATCH Score: 8  Interventions Interventions: Assisted with latch;Support pillows  Lactation Tools Discussed/Used Tools: Nipple Shields Nipple shield size: 16   Consult Status Consult Status: Complete Follow-up type: Call as needed    Huston Foley 06/28/2018, 9:17 AM

## 2018-07-17 ENCOUNTER — Ambulatory Visit: Payer: Self-pay

## 2018-07-17 NOTE — Lactation Note (Addendum)
This note was copied from a baby's chart. Lactation Consultation Note  Patient Name: Anne [name] ZOXWR'U Date: 07/17/2018 Reason for consult: Follow-up assessment;Term  P2 mother whose infant is now 66 weeks old.  This is a term baby who was readmitted with cough, congestion and poor feeding.  He tested positive for RSV.  Mother stated that his 21 year old sister was diagnosed with RSV last week.  Baby has not regained to birthweight.  Baby was sleeping when I arrived.  No respiratory difficulty noted.  Offered to assist with feeding and mother accepted.  Mother changed a wet diaper prior to me assessing his suck.  On my gloved finger he was able to suck effectively, although he did appear to be tired.    Mother's breasts are soft and non tender with smaller amount of breast tissue.  Her nipples are small and intact.  Mother stated she prefers the cross cradle position but likes to hold in the cradle position after he latches.  Suggested she latch and continue to feed in the cross cradle position and explanation as to why this is more beneficial provided.  Mother in agreement.  Reviewed how to hold breast, fingers and hand to help baby obtain a good deep latch.  Mother is quick to try to latch before baby opens wide.  Demonstrated waiting for a wide mouth and then placing baby deep into the breast tissue.  Mother observed correct body alignment and proper support to keep baby at the level of the breast.  Baby had wide gape and flanged lips.  He did require constant gentle stimulation to continue sucking.  Reviewed breast compressions during feedings.  Audible swallows noted.  Observed baby feeding on/off for about 25 minutes and, at the end as he became more tired, I reminded mother not to feed longer than 30 minutes at a time, being sure he remains active with his feedings. Baby fed easily until the very end when he became a little bit congested.  Mother will call RN to suction prior to the next  feeding.   Mother has not been pumping every three hours and I encouraged her to diligently do this.  She talked about her 21 year old special needs child at home and that it has been hard to do this.  Education provided on how to make this process easier for her.  Also, suggested she awaken baby during the night to feed.  Baby will sleep if she allows this to happen.  Reminded her to be very diligent about pumping immediately after feedings to allow "rest" time for baby and mom in between feedings.  Mother verbalized understanding.  She desires to exclusively breast feed and I reminded her that this will not happen immediately after discharge, knowing that the doctors are going to require baby be supplemented until weight gain is improved and illness has resolved.  She will be able to feed more of her EBM if she pumps regularly and includes hand expression.    Reviewed the pump set up and readjusted the junctures to provide more suction.  Mother familiar with the settings and how to gauge effectiveness of pumpings.  She had no further questions/concerns and will call RN for further questions.  MD updated.       Maternal Data Formula Feeding for Exclusion: No Has patient been taught Hand Expression?: Yes Does the patient have breastfeeding experience prior to this delivery?: No  Feeding Feeding Type: Breast Fed Nipple Type: Dr. Irving Burton  Preemie  LATCH Score Latch: Repeated attempts needed to sustain latch, nipple held in mouth throughout feeding, stimulation needed to elicit sucking reflex.  Audible Swallowing: Spontaneous and intermittent  Type of Nipple: Everted at rest and after stimulation(short shafted)  Comfort (Breast/Nipple): Soft / non-tender  Hold (Positioning): Assistance needed to correctly position infant at breast and maintain latch.  LATCH Score: 8  Interventions Interventions: Breast feeding basics reviewed;Assisted with latch;Skin to skin;Breast massage;Hand  express;Breast compression;Expressed milk;Position options;Adjust position;DEBP  Lactation Tools Discussed/Used Tools: Pump WIC Program: Yes Initiated by:: Already initiated(Reviewed settings and junctures in pump tubing were open: sealed openings)   Consult Status Consult Status: PRN Date: 07/17/18 Follow-up type: Call as needed    Anne [name] R Anne [name] 07/17/2018, 3:30 PM

## 2018-08-09 NOTE — Telephone Encounter (Signed)
Opened in error

## 2018-08-23 ENCOUNTER — Ambulatory Visit (HOSPITAL_COMMUNITY)
Admission: RE | Admit: 2018-08-23 | Discharge: 2018-08-23 | Disposition: A | Discharge status: Home / Self Care | Payer: Medicaid Other | Attending: Psychiatry | Admitting: Psychiatry

## 2018-08-23 NOTE — H&P (Signed)
Behavioral Health Medical Screening Exam  Anne [name] is an 23 y.o. female.presents with her husband with c/o of MDD with recent urges to cut, but denies SI/SA or HI. She is on Prozac 20 mg daily with continued sadness, and anhedonia.  Total Time spent with patient: 15 minutes  Psychiatric Specialty Exam: Physical Exam  Constitutional: She is oriented to person, place, and time. She appears well-developed and well-nourished.  HENT:  Head: Normocephalic and atraumatic.  Eyes: Pupils are equal, round, and reactive to light.  Respiratory: Effort normal and breath sounds normal.  Neurological: She is alert and oriented to person, place, and time. No cranial nerve deficit.  Skin: Skin is warm and dry.  Psychiatric: Her speech is normal and behavior is normal. Judgment normal. Her mood appears not anxious. Cognition and memory are normal. She does not exhibit a depressed mood. She expresses suicidal ideation. She expresses no homicidal ideation. She expresses no suicidal plans and no homicidal plans.    Review of Systems  Constitutional: Negative for chills, diaphoresis, fever, malaise/fatigue and weight loss.  Psychiatric/Behavioral: Positive for depression and suicidal ideas. Negative for hallucinations and substance abuse. The patient is nervous/anxious.   All other systems reviewed and are negative.   unknown if currently breastfeeding.There is no height or weight on file to calculate BMI.  General Appearance: Casual  Eye Contact:  Good  Speech:  Clear and Coherent  Volume:  Normal  Mood:  Depressed  Affect:  Congruent  Thought Process:  Goal Directed  Orientation:  Full (Time, Place, and Person)  Thought Content:  Logical  Suicidal Thoughts:  Yes.  without intent/plan  Homicidal Thoughts:  No  Memory:  Immediate;   Fair  Judgement:  Fair  Insight:  Fair  Psychomotor Activity:  Normal  Concentration: Concentration: Fair  Recall:  [employer] of Knowledge:Fair  Language: Fair   Akathisia:  Negative  Handed:  Right  AIMS (if indicated):     Assets:  Desire for Improvement  Sleep:       Musculoskeletal: Strength & Muscle Tone: within normal limits Gait & Station: normal Patient leans: N/A  unknown if currently breastfeeding.  Recommendations:  Based on my evaluation the patient does not appear to have an emergency medical condition.  Kerry Hough, PA-C 08/23/2018, 9:48 PM
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
## 2019-03-25 ENCOUNTER — Encounter (HOSPITAL_BASED_OUTPATIENT_CLINIC_OR_DEPARTMENT_OTHER): Payer: Self-pay | Admitting: *Deleted

## 2019-03-25 ENCOUNTER — Emergency Department (HOSPITAL_BASED_OUTPATIENT_CLINIC_OR_DEPARTMENT_OTHER): Payer: Medicaid Other

## 2019-03-25 ENCOUNTER — Emergency Department (HOSPITAL_BASED_OUTPATIENT_CLINIC_OR_DEPARTMENT_OTHER)
Admission: EM | Admit: 2019-03-25 | Discharge: 2019-03-25 | Disposition: A | Discharge status: Home / Self Care | Payer: Medicaid Other | Attending: Emergency Medicine | Admitting: Emergency Medicine

## 2019-03-25 ENCOUNTER — Other Ambulatory Visit: Payer: Self-pay

## 2019-03-25 DIAGNOSIS — J45909 Unspecified asthma, uncomplicated: Secondary | ICD-10-CM | POA: Diagnosis not present

## 2019-03-25 DIAGNOSIS — Z87891 Personal history of nicotine dependence: Secondary | ICD-10-CM | POA: Diagnosis not present

## 2019-03-25 DIAGNOSIS — M25572 Pain in left ankle and joints of left foot: Secondary | ICD-10-CM | POA: Diagnosis not present

## 2019-03-25 NOTE — ED Triage Notes (Signed)
Pt reports left ankle pain x 1 month, seen at urgent care last week, dx tendonitis, cont with pain, requests second opinion.

## 2019-03-25 NOTE — ED Provider Notes (Signed)
[facility] EMERGENCY DEPARTMENT Provider Note   CSN: [id number] Arrival date & time: 03/25/19  1302     History   Chief Complaint Chief Complaint  Patient presents with  . Ankle Pain    HPI Anne [name] is a 23 y.o. female.     Patient is a 23 year old female past history of anxiety, asthma who presents the emergency department for left ankle pain.  Patient reports that about 2 months ago she woke up with pain in her ankle.  Reports that the pain comes and goes.  There are no specific exacerbating or relieving factors.  Reports that is mostly her anterior bilateral ankle.  She denies any fever, swelling, tick bites, skin changes.  Reports that she was seen at urgent care just a couple of days ago who diagnosed her with tendinitis and advised on conservative treatments.  Reports that they did not do any imaging and she wanted a second opinion.     Past Medical History:  Diagnosis Date  . Anxiety   . Asthma   . Depression   . History of cystitis   . Ovarian cyst     Patient Active Problem List   Diagnosis Date Noted  . Pregnancy 06/26/2018  . Pregnant 07/02/2017  . 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 Hirsutism 02/20/2015  . Irregular periods 02/20/2015  . 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 Mood disorder (HCC) 08/26/2014    Past Surgical History:  Procedure Laterality Date  . NO PAST SURGERIES       OB History    Gravida  2   Para  2   Term  2   Preterm      AB      Living  2     SAB      TAB      Ectopic      Multiple  0   Live Births  2            Home Medications    Prior to Admission medications   Medication Sig Start Date End Date Taking? Authorizing Provider  albuterol (PROVENTIL HFA;VENTOLIN HFA) 108 (90 Base) MCG/ACT inhaler Inhale 1-2 puffs into the lungs every 6 (six) hours as needed for wheezing or shortness of breath.    [provider]  Prenatal Vit-Fe  Fumarate-FA (PRENATAL MULTIVITAMIN) TABS tablet Take 1 tablet by mouth daily at 12 noon.    [provider]    Family History Family History  Problem Relation Age of Onset  . Depression Mother   . Anxiety disorder Mother   . Depression Maternal Grandmother   . Anxiety disorder Maternal Grandmother   . Thyroid disease Maternal Grandmother   . Stroke Maternal Grandmother   . Hypertension Maternal Grandmother   . Depression Maternal Grandfather   . Anxiety disorder Maternal Grandfather   . Stroke Maternal Grandfather   . Hypertension Maternal Grandfather   . Heart disease Maternal Grandfather   . Melanoma Paternal Grandmother   . Hypertension Paternal Grandfather   . Heart disease Paternal Grandfather     Social History Social History   Tobacco Use  . Smoking status: Former Games developer  . Smokeless tobacco: Never Used  Substance Use Topics  . Alcohol use: No    Alcohol/week: 0.0 standard drinks  . Drug use: No     Allergies   Patient has no known allergies.   Review of Systems Review of Systems  Constitutional: Negative for chills  and fever.  Respiratory: Negative for shortness of breath.   Cardiovascular: Negative for chest pain and leg swelling.  Genitourinary: Negative for dysuria.  Musculoskeletal: Positive for arthralgias. Negative for back pain, gait problem, joint swelling, myalgias, neck pain and neck stiffness.  Skin: Negative for rash and wound.  Neurological: Negative for dizziness and light-headedness.     Physical Exam Updated Vital Signs BP 102/64 (BP Location: Left Arm)   Pulse 71   Temp 98 F (36.7 C) (Oral)   Resp 16   Ht 5\' 1"  (1.549 m)   Wt 68 kg   LMP 03/04/2019   SpO2 100%   BMI 28.34 kg/m   Physical Exam Vitals signs and nursing note reviewed.  Constitutional:      Appearance: Normal appearance.  HENT:     Head: Normocephalic.     Mouth/Throat:     Mouth: Mucous membranes are moist.  Eyes:     Conjunctiva/sclera:  Conjunctivae normal.     Pupils: Pupils are equal, round, and reactive to light.  Pulmonary:     Effort: Pulmonary effort is normal.  Musculoskeletal:     Right ankle: Normal.     Left ankle: Normal. She exhibits no deformity and normal pulse. No tenderness. Achilles tendon exhibits no pain, no defect and normal Thompson's test results.  Skin:    General: Skin is warm and dry.     Findings: No bruising, erythema or rash.  Neurological:     General: No focal deficit present.     Mental Status: She is alert.  Psychiatric:        Mood and Affect: Mood normal.      ED Treatments / Results  Labs (all labs ordered are listed, but only abnormal results are displayed) Labs Reviewed - No data to display  EKG None  Radiology Dg Ankle Complete Left  Result Date: 03/25/2019 CLINICAL DATA:  Left ankle pain. EXAM: LEFT ANKLE COMPLETE - 3+ VIEW COMPARISON:  None. FINDINGS: No fracture, dislocation, or destructive osseous process is identified. Joint space widths are preserved. A 6 mm sclerotic focus in the lateral malleolus is suggestive of a bone island. The soft tissues are unremarkable. IMPRESSION: No acute osseous abnormality. Electronically Signed   By: Logan Bores M.D.   On: 03/25/2019 14:24    Procedures Procedures (including critical care time)  Medications Ordered in ED Medications - No data to display   Initial Impression / Assessment and Plan / ED Course  I have reviewed the triage vital signs and the nursing notes.  Pertinent labs & imaging results that were available during my care of the patient were reviewed by me and considered in my medical decision making (see chart for details).        Based on review of vitals, medical screening exam, lab work and/or imaging, there does not appear to be an acute, emergent etiology for the patient's symptoms. Counseled pt on good return precautions and encouraged both PCP and ED follow-up as needed.  Prior to discharge, I also  discussed incidental imaging findings with patient in detail and advised appropriate, recommended follow-up in detail.  Clinical Impression: 1. Acute left ankle pain     Disposition: Discharge  Prior to providing a prescription for a controlled substance, I independently reviewed the patient's recent prescription history on the [facility]. The patient had no recent or regular prescriptions and was deemed appropriate for a brief, less than 3 day prescription of narcotic for acute  analgesia.  This note was prepared with assistance of Conservation officer, historic buildingsDragon voice recognition software. Occasional wrong-word or sound-a-like substitutions may have occurred due to the inherent limitations of voice recognition software.   Final Clinical Impressions(s) / ED Diagnoses   Final diagnoses:  Acute left ankle pain    ED Discharge Orders    None       Jeral PinchMcLean, Soua Caltagirone A, PA-C 03/25/19 2239    Terrilee FilesButler, Michael C, MD 03/26/19 1904

## 2019-03-25 NOTE — Discharge Instructions (Addendum)
You were seen today for left ankle pain. Your xray did not show any break/fracture or dislocation or other bony problem that should be causing your symptoms. It is possible that you have sprained or irritated a tendon or muscle. This is treated with rest, ice, compression (ace wrap), elevation of your ankle when not in use, and over the counter anti inflammatories like ibuprofen or aleve. Sprains and strains can take several weeks and sometimes months to heal. If you have any changes like swelling, skin color changes, numbness, fever or multiple joint aches all at ones as this could indicate an additional problem. Otherwise, you can follow up with your primary care provider routinely. Or you may see the sports medicine center right here at med center for further evaluation and treatment. Thank you for allowing me to care for you today.

## 2019-05-17 NOTE — L&D Delivery Note (Signed)
Delivery Note At 1:42 PM a viable female was delivered via  (Presentation: LOA).  APGAR: 8, 9; weight pending.   Placenta status:  S, I. 3V Cord with the following complications: none.  Cord pH: n/a  Anesthesia:  lidocaine Episiotomy:  n/a Lacerations:  2nd degree Suture Repair: 3.0 vicryl rapide Est. Blood Loss (mL):  300  Mom to postpartum.  Baby to Couplet care / Skin to Skin.  Mitchel Honour 03/31/2020, 2:07 PM

## 2019-08-22 LAB — OB RESULTS CONSOLE ABO/RH: RH Type: POSITIVE

## 2019-08-22 LAB — OB RESULTS CONSOLE HIV ANTIBODY (ROUTINE TESTING): HIV: NONREACTIVE

## 2019-08-22 LAB — OB RESULTS CONSOLE GC/CHLAMYDIA
Chlamydia: NEGATIVE
Gonorrhea: NEGATIVE

## 2019-08-22 LAB — OB RESULTS CONSOLE HEPATITIS B SURFACE ANTIGEN: Hepatitis B Surface Ag: NEGATIVE

## 2019-08-22 LAB — OB RESULTS CONSOLE RPR: RPR: NONREACTIVE

## 2019-08-22 LAB — OB RESULTS CONSOLE ANTIBODY SCREEN: Antibody Screen: NEGATIVE

## 2019-08-22 LAB — OB RESULTS CONSOLE RUBELLA ANTIBODY, IGM: Rubella: IMMUNE

## 2019-10-28 ENCOUNTER — Encounter: Payer: Self-pay | Admitting: Cardiology

## 2019-10-28 ENCOUNTER — Ambulatory Visit: Payer: Medicaid Other

## 2019-10-28 ENCOUNTER — Ambulatory Visit: Payer: Medicaid Other | Admitting: Cardiology

## 2019-10-28 ENCOUNTER — Other Ambulatory Visit: Payer: Self-pay

## 2019-10-28 VITALS — BP 111/63 | HR 88 | Ht 61.0 in | Wt 147.0 lb

## 2019-10-28 DIAGNOSIS — R002 Palpitations: Secondary | ICD-10-CM

## 2019-10-28 DIAGNOSIS — Z87891 Personal history of nicotine dependence: Secondary | ICD-10-CM

## 2019-10-28 DIAGNOSIS — Z3A17 17 weeks gestation of pregnancy: Secondary | ICD-10-CM

## 2019-10-28 DIAGNOSIS — R42 Dizziness and giddiness: Secondary | ICD-10-CM

## 2019-10-28 NOTE — Progress Notes (Signed)
Date:  10/28/2019   ID:  ARLISHA PATALANO, DOB August 31, 1995, MRN [id number]  PCP:  Berkley Harvey, NP  Cardiologist:  Rex Kras, DO, [facility] (established care 10/28/2019)  REASON FOR CONSULT: Palpitations, 17th week gestation.  REQUESTING PHYSICIAN:  Berkley Harvey, NP [place],  [place] [id number]  Chief Complaint  Patient presents with  . Palpitations    pt was having palpitations, and is now having dizzy spells  . New Patient (Initial Visit)  . Dizziness    HPI  Anne [name] is a 24 y.o. female who is being seen today for the evaluation of palpitations and dizziness at the request of Berkley Harvey, NP. Patient's past medical history and cardiac risk factors include: 17th week gestation and former smoker.   Patient presents to the office at the request of her primary care provider for evaluation of palpitations.  Patient states that approximately 1-1.56month ago she started having palpitations lasting for 20 to 30 seconds and progressed to about 30 minutes in duration.  She did not have any associated chest pain or dyspnea but more noticeable and voiced it to her primary care provider.  Fortunately, her palpitations are essentially resolved but now has started experiencing lightheaded and dizziness.  Patient states that her lightheaded and dizziness are usually with changes of concern.  It happens multiple times during the day.  She has had one episode last week where she felt lightheaded and dizzy and slid down against the wall to prevent a fall.  She consumes approximately 2 meals a day, drinks about 4 glasses of sweet tea and 2 or 3 bottles of water.  She was found to be anemic during her prior pregnancies requiring iron infusions.  Her last hemoglobin was checked in April and it was 13.5 g/dL.  She has an upcoming appointment with her OB next week to check her hemoglobin.  This is patient's third pregnancy and her prior pregnancy were overall uneventful.   No  family history of premature coronary disease or sudden cardiac death.  Denies prior history of coronary artery disease, myocardial infarction, congestive heart failure, deep venous thrombosis, pulmonary embolism, stroke, transient ischemic attack.  ALLERGIES: No Known Allergies  MEDICATION LIST PRIOR TO VISIT: Current Meds  Medication Sig  . albuterol (PROVENTIL HFA;VENTOLIN HFA) 108 (90 Base) MCG/ACT inhaler Inhale 1-2 puffs into the lungs every 6 (six) hours as needed for wheezing or shortness of breath.  . Prenatal Vit-Fe Fumarate-FA (PRENATAL MULTIVITAMIN) TABS tablet Take 1 tablet by mouth daily at 12 noon.     PAST MEDICAL HISTORY: Past Medical History:  Diagnosis Date  . Anxiety   . Asthma   . Depression   . History of cystitis   . Ovarian cyst     PAST SURGICAL HISTORY: Past Surgical History:  Procedure Laterality Date  . NO PAST SURGERIES      FAMILY HISTORY: The patient family history includes Anxiety disorder in her maternal grandfather, maternal grandmother, and mother; Depression in her maternal grandfather, maternal grandmother, and mother; Heart disease in her maternal grandfather and paternal grandfather; Hypertension in her maternal grandfather, maternal grandmother, and paternal grandfather; Melanoma in her paternal grandmother; Stroke in her maternal grandfather and maternal grandmother; Thyroid disease in her maternal grandmother.  SOCIAL HISTORY:  The patient  reports that she has quit smoking. She has never used smokeless tobacco. She reports that she does not drink alcohol and does not use drugs.  REVIEW OF  SYSTEMS: Review of Systems  Constitutional: Negative for chills and fever.  HENT: Negative for hoarse voice and nosebleeds.   Eyes: Negative for discharge, double vision and pain.  Cardiovascular: Negative for chest pain, claudication, dyspnea on exertion, leg swelling, near-syncope, orthopnea, palpitations, paroxysmal nocturnal dyspnea and syncope.    Respiratory: Negative for hemoptysis and shortness of breath.   Musculoskeletal: Negative for muscle cramps and myalgias.  Gastrointestinal: Negative for abdominal pain, constipation, diarrhea, hematemesis, hematochezia, melena, nausea and vomiting.  Neurological: Positive for dizziness (with change in position) and light-headedness (with change in position).    PHYSICAL EXAM: Vitals with BMI 10/28/2019 03/25/2019 03/25/2019  Height '5\' 1"'  - '5\' 1"'   Weight 147 lbs - 150 lbs  BMI 47.09 - 62.83  Systolic 662 947 -  Diastolic 63 64 -  Pulse 88 71 -   Orthostatic VS for the past 72 hrs (Last 3 readings):  Orthostatic BP Patient Position BP Location Cuff Size Orthostatic Pulse  10/28/19 1122 -- Sitting Left Arm Normal --  10/28/19 1121 96/66 Standing Left Arm Normal 114  10/28/19 1120 118/75 Sitting Left Arm Normal 90  10/28/19 1114 94/60 Supine Left Arm Normal 78   CONSTITUTIONAL: Well-developed and well-nourished. No acute distress.  SKIN: Skin is warm and dry. No rash noted. No cyanosis. No pallor. No jaundice HEAD: Normocephalic and atraumatic.  EYES: No scleral icterus MOUTH/THROAT: Moist oral membranes.  NECK: No JVD present. No thyromegaly noted. No carotid bruits  LYMPHATIC: No visible cervical adenopathy.  CHEST Normal respiratory effort. No intercostal retractions  LUNGS: Clear to auscultation bilaterally.  No stridor. No wheezes. No rales.  CARDIOVASCULAR: Regular rate and rhythm, positive S1-S2, no murmurs rubs or gallops appreciated ABDOMINAL: No apparent ascites.  EXTREMITIES: No peripheral edema  HEMATOLOGIC: No significant bruising NEUROLOGIC: Oriented to person, place, and time. Nonfocal. Normal muscle tone.  PSYCHIATRIC: Normal mood and affect. Normal behavior. Cooperative  CARDIAC DATABASE: EKG: 10/28/2019: Normal sinus rhythm, 88 bpm, normal axis, IRBBB, baseline artifact in multiple leads.   Echocardiogram: None  Stress Testing:  None  Heart  Catheterization: None  LABORATORY DATA: External labs: Collected: 10/03/2019 Creatinine 0.52 mg/dL. eGFR: > 90 mL/min per 1.73 m TSH: 1.34  Hemoglobin 13.5 g/dL as of August 26, 2019 (outside records).  IMPRESSION:    ICD-10-CM   1. Palpitations  R00.2 EKG 12-Lead    HOLTER MONITOR - 24 HOUR  2. Orthostatic dizziness  R42   3. Former smoker  Z87.891   4. [redacted] weeks gestation of pregnancy  Z3A.17      RECOMMENDATIONS: Anne [name] is a 24 y.o. female whose past medical history and cardiac risk factors include: 17th week pregnancy, former smoker.  Palpitations:  States her symptoms are better compared to 1-1.5 months ago.   24-hour Holter monitor to evaluate for underlying arrhythmic burden.  Lightheaded/dizziness:  Most likely secondary to orthostatic hypotension with changing positions.  Orthostatic vital signs positive.  Patient is encouraged to consume at least 3 well-balanced meals on a daily basis.  She is encouraged to decrease her sweet tea intake and substitute that with either water or beverage with electrolyte replacement (I.e. Gatorade).   Educated her on decreasing sugar intake as it may facilitate diuresis.  She is asked to change positions slowly.   Compression stockings may also help her symptoms.  Consider increasing salt intake in her diet.  She is educated on following up with her OB appointment next week to have her hemoglobin checked.  Patient had iron  infusions during the last pregnancy.  Educated on implementing these nonpharmacological therapies to help improve her lightheadedness and dizziness.  Given the fact that she is [redacted] weeks pregnant would like to minimize radiation exposure to the patient; however, if patient continues to have symptoms despite lifestyle changes will consider echocardiogram at the next visit.   Former smoker: Educated on the importance of continued smoking cessation.  Plan of care discussed with the patient and she  finds it acceptable.  FINAL MEDICATION LIST END OF ENCOUNTER: No orders of the defined types were placed in this encounter.   There are no discontinued medications.   Current Outpatient Medications:  .  albuterol (PROVENTIL HFA;VENTOLIN HFA) 108 (90 Base) MCG/ACT inhaler, Inhale 1-2 puffs into the lungs every 6 (six) hours as needed for wheezing or shortness of breath., Disp: , Rfl:  .  Prenatal Vit-Fe Fumarate-FA (PRENATAL MULTIVITAMIN) TABS tablet, Take 1 tablet by mouth daily at 12 noon., Disp: , Rfl:   Orders Placed This Encounter  Procedures  . HOLTER MONITOR - 24 HOUR  . EKG 12-Lead    There are no Patient Instructions on file for this visit.   --Continue cardiac medications as reconciled in final medication list. --Return in about 4 weeks (around 11/25/2019) for re-evaluation of symptoms.. Or sooner if needed. --Continue follow-up with your primary care physician regarding the management of your other chronic comorbid conditions.  Patient's questions and concerns were addressed to her satisfaction. She voices understanding of the instructions provided during this encounter.   This note was created using a voice recognition software as a result there may be grammatical errors inadvertently enclosed that do not reflect the nature of this encounter. Every attempt is made to correct such errors.  Rex Kras, [place], [facility]  Pager: [phone] Office: [phone]

## 2019-11-26 ENCOUNTER — Encounter: Payer: Self-pay | Admitting: Cardiology

## 2019-11-26 ENCOUNTER — Ambulatory Visit: Payer: Medicaid Other | Admitting: Cardiology

## 2019-11-26 ENCOUNTER — Other Ambulatory Visit: Payer: Self-pay

## 2019-11-26 VITALS — BP 107/80 | HR 89 | Resp 16 | Ht 61.0 in | Wt 148.0 lb

## 2019-11-26 DIAGNOSIS — I493 Ventricular premature depolarization: Secondary | ICD-10-CM

## 2019-11-26 DIAGNOSIS — Z87891 Personal history of nicotine dependence: Secondary | ICD-10-CM

## 2019-11-26 DIAGNOSIS — Z3A22 22 weeks gestation of pregnancy: Secondary | ICD-10-CM

## 2019-11-26 DIAGNOSIS — R002 Palpitations: Secondary | ICD-10-CM

## 2019-11-26 NOTE — Progress Notes (Signed)
Date:  11/26/2019   ID:  Anne [name], DOB Jun 15, 1995, MRN [id number]  PCP:  Berkley Harvey, NP  Cardiologist:  Rex Kras, DO, [facility] (established care 10/28/2019)  REASON FOR CONSULT: Palpitations, 17th week gestation.  REQUESTING PHYSICIAN:  Berkley Harvey, NP [place],  [place] [address]  Chief Complaint  Patient presents with  . Follow-up    4 week  . Palpitations    HPI  Anne [name] is a 24 y.o. female who presents to the office with a chief complaint of review test results and reevaluation of palpitations. Patient's past medical history and cardiac risk factors include: 22nd week gestation and former smoker.   She was referred to the office back in June 2021 at the request of her primary care provider for evaluation of palpitations during pregnancy.  However, at last office visit patient states that the symptoms of palpitations had improved however was having symptoms of lightheadedness and dizziness.  Patient was symptomatic when obtaining orthostatic vitals therefore she was recommended to decrease the amount of sweet tea consumption, having 3 balanced meals per day, increasing salt in her diet, changing positions slowly, and compression stockings if needed.   Since last office visit patient has undergone Holter monitor results which were discussed with her in great detail at today's office visit.  No significant dysrhythmias noted; however, her ventricular ectopic burden is approximately 19.7%.  In regards to palpitations patient states that her symptoms are still present but less intense, less frequent, and lasting shorter in duration.  In regards to her lightheadedness and dizziness her symptoms have improved but still present and mostly notable with changing from sitting to standing position.  Patient states that she has not had any near syncope or syncopal events.  She has increased her fluid intake and still consumes 2 meals per day.   She is  currently 22 weeks into gestation and had a recent OB visit and her pregnancy is progressing well.   This is patient's third pregnancy and her prior pregnancy were overall uneventful.   No family history of premature coronary disease or sudden cardiac death.  Denies prior history of coronary artery disease, myocardial infarction, congestive heart failure, deep venous thrombosis, pulmonary embolism, stroke, transient ischemic attack.  ALLERGIES: No Known Allergies  MEDICATION LIST PRIOR TO VISIT: Current Meds  Medication Sig  . albuterol (PROVENTIL HFA;VENTOLIN HFA) 108 (90 Base) MCG/ACT inhaler Inhale 1-2 puffs into the lungs every 6 (six) hours as needed for wheezing or shortness of breath.  . Prenatal Vit-Fe Fumarate-FA (PRENATAL MULTIVITAMIN) TABS tablet Take 1 tablet by mouth daily at 12 noon.     PAST MEDICAL HISTORY: Past Medical History:  Diagnosis Date  . Anxiety   . Asthma   . Depression   . History of cystitis   . Ovarian cyst     PAST SURGICAL HISTORY: Past Surgical History:  Procedure Laterality Date  . NO PAST SURGERIES      FAMILY HISTORY: The patient family history includes Anxiety disorder in her maternal grandfather, maternal grandmother, and mother; Depression in her maternal grandfather, maternal grandmother, and mother; Heart disease in her maternal grandfather and paternal grandfather; Hypertension in her maternal grandfather, maternal grandmother, and paternal grandfather; Melanoma in her paternal grandmother; Stroke in her maternal grandfather and maternal grandmother; Thyroid disease in her maternal grandmother.  SOCIAL HISTORY:  The patient  reports that she has quit smoking. She has never used smokeless tobacco. She  reports that she does not drink alcohol and does not use drugs.  REVIEW OF SYSTEMS: Review of Systems  Constitutional: Negative for chills and fever.  HENT: Negative for hoarse voice and nosebleeds.   Eyes: Negative for discharge,  double vision and pain.  Cardiovascular: Negative for chest pain, claudication, dyspnea on exertion, leg swelling, near-syncope, orthopnea, palpitations, paroxysmal nocturnal dyspnea and syncope.  Respiratory: Negative for hemoptysis and shortness of breath.   Musculoskeletal: Negative for muscle cramps and myalgias.  Gastrointestinal: Negative for abdominal pain, constipation, diarrhea, hematemesis, hematochezia, melena, nausea and vomiting.  Neurological: Positive for dizziness (with change in position) and light-headedness (with change in position).    PHYSICAL EXAM: Vitals with BMI 11/26/2019 10/28/2019 03/25/2019  Height _0  _1  -  Weight 148 lbs 147 lbs -  BMI 50.27 74.12 -  Systolic 878 676 720  Diastolic 80 63 64  Pulse 89 88 71   Orthostatic VS for the past 72 hrs (Last 3 readings):  Patient Position BP Location Cuff Size  11/26/19 1553 Sitting Left Arm Normal   CONSTITUTIONAL: Well-developed and well-nourished. No acute distress.  SKIN: Skin is warm and dry. No rash noted. No cyanosis. No pallor. No jaundice HEAD: Normocephalic and atraumatic.  EYES: No scleral icterus MOUTH/THROAT: Moist oral membranes.  NECK: No JVD present. No thyromegaly noted. No carotid bruits  LYMPHATIC: No visible cervical adenopathy.  CHEST Normal respiratory effort. No intercostal retractions  LUNGS: Clear to auscultation bilaterally.  No stridor. No wheezes. No rales.  CARDIOVASCULAR: Regular rate and rhythm, positive S1-S2, no murmurs rubs or gallops appreciated ABDOMINAL: No apparent ascites.  EXTREMITIES: No peripheral edema  HEMATOLOGIC: No significant bruising NEUROLOGIC: Oriented to person, place, and time. Nonfocal. Normal muscle tone.  PSYCHIATRIC: Normal mood and affect. Normal behavior. Cooperative  CARDIAC DATABASE: EKG: 10/28/2019: Normal sinus rhythm, 88 bpm, normal axis, IRBBB, baseline artifact in multiple leads.   Echocardiogram: None  Stress Testing:  None  Heart  Catheterization: None  LABORATORY DATA: External labs: Collected: 10/03/2019 Creatinine 0.52 mg/dL. eGFR: > 90 mL/min per 1.73 m TSH: 1.34  Hemoglobin 13.5 g/dL as of August 26, 2019 (outside records).  24 hour Holter monitor: Dominant rhythm normal sinus rhythm. Heart rate 66-165 bpm.  Average heart rate 89bpm. No atrial fibrillation/atrial flutter/high grade AV block, sinus pause greater than or equal to 3 seconds in duration. Ventricular ectopy was 26,363 and total ventricular ectopic burden 19.7%. Total supraventricular ectopic burden 0%. Number of patient triggered events: 0.   IMPRESSION:    ICD-10-CM   1. Palpitations  R00.2   2. PVC (premature ventricular contraction)  I49.3 PCV ECHOCARDIOGRAM COMPLETE  3. [redacted] weeks gestation of pregnancy  Z3A.22   4. Former smoker  Z87.891      RECOMMENDATIONS: Anne [name] is a 24 y.o. female whose past medical history and cardiac risk factors include: 17th week pregnancy, former smoker.  Palpitations:  Symptoms have decreased in intensity, frequency, and duration.  24-hour Holter monitor results reviewed with the patient.  Patient understands that her total ventricular ectopic burden is calculated to be 19.7%.  If the symptoms are progressive consider metoprolol tartrate.  However I did inform the patient that per medical literature caution is advised during pregnancy especially the second and third trimester.  The risk of teratogenicity not expected based on the limited human data; possible risk for intrauterine growth restriction and neonatal adverse effect including bradycardia and hypoglycemia based on the limited or conflicting human data.  I have asked  her to discuss the use of beta-blocker therapy with her OB/GYN at the next office visit.  We will plan surface echocardiogram to evaluate LV function for further with stratification.  Premature ventricular contractions: See above  Lightheaded/dizziness: Stable.  She is  asked to change positions slowly.   Compression stockings may also help her symptoms.  Consider increasing salt intake in her diet.  Hemoglobin is currently being followed by her OB as she has had a history of iron infusions.   Educated on implementing these nonpharmacological therapies to help improve her lightheadedness and dizziness.    Former smoker: Educated on the importance of continued smoking cessation.  Plan of care discussed with the patient and she finds it acceptable.  FINAL MEDICATION LIST END OF ENCOUNTER: No orders of the defined types were placed in this encounter.   There are no discontinued medications.   Current Outpatient Medications:  .  albuterol (PROVENTIL HFA;VENTOLIN HFA) 108 (90 Base) MCG/ACT inhaler, Inhale 1-2 puffs into the lungs every 6 (six) hours as needed for wheezing or shortness of breath., Disp: , Rfl:  .  Prenatal Vit-Fe Fumarate-FA (PRENATAL MULTIVITAMIN) TABS tablet, Take 1 tablet by mouth daily at 12 noon., Disp: , Rfl:   Orders Placed This Encounter  Procedures  . PCV ECHOCARDIOGRAM COMPLETE    There are no Patient Instructions on file for this visit.   --Continue cardiac medications as reconciled in final medication list. --Return in about 8 weeks (around 01/21/2020) for re-evaluation of symptoms., review test results.. Or sooner if needed. --Continue follow-up with your primary care physician regarding the management of your other chronic comorbid conditions.  Patient's questions and concerns were addressed to her satisfaction. She voices understanding of the instructions provided during this encounter.   This note was created using a voice recognition software as a result there may be grammatical errors inadvertently enclosed that do not reflect the nature of this encounter. Every attempt is made to correct such errors.  Rex Kras, [place], [facility]  Pager: [phone] Office: [phone]

## 2019-12-04 ENCOUNTER — Ambulatory Visit: Payer: Medicaid Other

## 2019-12-04 ENCOUNTER — Other Ambulatory Visit: Payer: Self-pay

## 2019-12-04 DIAGNOSIS — I493 Ventricular premature depolarization: Secondary | ICD-10-CM

## 2020-01-20 NOTE — Progress Notes (Deleted)
Date:  01/20/2020   ID:  Anne [name], DOB Oct 19, 1995, MRN [id number]  PCP:  Berkley Harvey, NP  Cardiologist:  Rex Kras, DO, [facility] (established care 10/28/2019)  REASON FOR CONSULT: Palpitations, 17th week gestation.  REQUESTING PHYSICIAN:  Berkley Harvey, NP [place],   [id number]  No chief complaint on file.   HPI  Anne [name] is a 23 y.o. female who presents to the office with a chief complaint of "review test results and reevaluation of palpitations." Patient's past medical history and cardiac risk factors include: *** week gestation and former smoker.   She was referred to the office back in June 2021 at the request of her primary care provider for evaluation of palpitations during pregnancy.  At last office visit patient states that the symptoms of palpitations had improved however was having symptoms of lightheadedness and dizziness.  Patient was symptomatic when obtaining orthostatic vitals therefore she was recommended to decrease the amount of sweet tea consumption, having 3 balanced meals per day, increasing salt in her diet, changing positions slowly, and compression stockings if needed.   In regards to palpitations patient underwent a 24-hour Holter monitor which noted a PVC burden of approximately 19.7%.  No other dysrhythmias noted.  Since his symptoms of palpitation have improved with the last office visit the shared decision was to hold off on the initiation of metoprolol tartrate after discussing the risks, benefits, and alternatives of beta-blocker therapy during pregnancy.  For further risk ratification we decided to proceed with echocardiogram.  In the interim, echocardiogram notes an LVEF which is preserved without any significant valvular heart disease.  Clinically, patient states that her symptoms have *** .  No near syncope or syncopal events.  Patient is consuming 3 balanced meals and is keeping herself well-hydrated.  She is  currently *** weeks into pregnancy and as per her last OB visit patient states that her pregnancy is progressing well.   This is patient's third pregnancy and her prior pregnancy were overall uneventful.   No family history of premature coronary disease or sudden cardiac death.  Denies prior history of coronary artery disease, myocardial infarction, congestive heart failure, deep venous thrombosis, pulmonary embolism, stroke, transient ischemic attack.  ALLERGIES: No Known Allergies  MEDICATION LIST PRIOR TO VISIT: No outpatient medications have been marked as taking for the 01/21/20 encounter (Appointment) with Terri Skains, Natascha Edmonds, DO.     PAST MEDICAL HISTORY: Past Medical History:  Diagnosis Date  . Anxiety   . Asthma   . Depression   . History of cystitis   . Ovarian cyst     PAST SURGICAL HISTORY: Past Surgical History:  Procedure Laterality Date  . NO PAST SURGERIES      FAMILY HISTORY: The patient family history includes Anxiety disorder in her maternal grandfather, maternal grandmother, and mother; Depression in her maternal grandfather, maternal grandmother, and mother; Heart disease in her maternal grandfather and paternal grandfather; Hypertension in her maternal grandfather, maternal grandmother, and paternal grandfather; Melanoma in her paternal grandmother; Stroke in her maternal grandfather and maternal grandmother; Thyroid disease in her maternal grandmother.  SOCIAL HISTORY:  The patient  reports that she has quit smoking. She has never used smokeless tobacco. She reports that she does not drink alcohol and does not use drugs.  REVIEW OF SYSTEMS: Review of Systems  Constitutional: Negative for chills and fever.  HENT: Negative for hoarse voice and nosebleeds.   Eyes: Negative for discharge,  double vision and pain.  Cardiovascular: Negative for chest pain, claudication, dyspnea on exertion, leg swelling, near-syncope, orthopnea, palpitations, paroxysmal nocturnal  dyspnea and syncope.  Respiratory: Negative for hemoptysis and shortness of breath.   Musculoskeletal: Negative for muscle cramps and myalgias.  Gastrointestinal: Negative for abdominal pain, constipation, diarrhea, hematemesis, hematochezia, melena, nausea and vomiting.  Neurological: Positive for dizziness (with change in position) and light-headedness (with change in position).    PHYSICAL EXAM: Vitals with BMI 11/26/2019 10/28/2019 03/25/2019  Height '5\' 1"'  '5\' 1"'  -  Weight 148 lbs 147 lbs -  BMI 02.11 15.52 -  Systolic 080 223 361  Diastolic 80 63 64  Pulse 89 88 71   No data found. CONSTITUTIONAL: Well-developed and well-nourished. No acute distress.  SKIN: Skin is warm and dry. No rash noted. No cyanosis. No pallor. No jaundice HEAD: Normocephalic and atraumatic.  EYES: No scleral icterus MOUTH/THROAT: Moist oral membranes.  NECK: No JVD present. No thyromegaly noted. No carotid bruits  LYMPHATIC: No visible cervical adenopathy.  CHEST Normal respiratory effort. No intercostal retractions  LUNGS: Clear to auscultation bilaterally.  No stridor. No wheezes. No rales.  CARDIOVASCULAR: Regular rate and rhythm, positive S1-S2, no murmurs rubs or gallops appreciated ABDOMINAL: No apparent ascites.  EXTREMITIES: No peripheral edema  HEMATOLOGIC: No significant bruising NEUROLOGIC: Oriented to person, place, and time. Nonfocal. Normal muscle tone.  PSYCHIATRIC: Normal mood and affect. Normal behavior. Cooperative  CARDIAC DATABASE: EKG: 10/28/2019: Normal sinus rhythm, 88 bpm, normal axis, IRBBB, baseline artifact in multiple leads.   Echocardiogram: 12/04/2019: *** Normal LV systolic function with visual EF 50-55%. No obvious regional wall motion abnormalities. Left ventricle cavity is normal in size. Normal wall thickness. Normal global wall motion. Normal diastolic filling pattern, normal LAP.  Mild tricuspid regurgitation. No evidence of pulmonary hypertension.  Mild pulmonic  regurgitation.  IVC is normal with a respiratory response of <50%.  No prior study for comparison.  Stress Testing:  None  Heart Catheterization: None  24 hour Holter monitor: Dominant rhythm normal sinus rhythm. Heart rate 66-165 bpm.  Average heart rate 89bpm. No atrial fibrillation/atrial flutter/high grade AV block, sinus pause greater than or equal to 3 seconds in duration. Ventricular ectopy was 26,363 and total ventricular ectopic burden 19.7%. Total supraventricular ectopic burden 0%. Number of patient triggered events: 0.   LABORATORY DATA: External labs: Collected: 10/03/2019 Creatinine 0.52 mg/dL. eGFR: > 90 mL/min per 1.73 m TSH: 1.34  Hemoglobin 13.5 g/dL as of August 26, 2019 (outside records).  IMPRESSION:  No diagnosis found.   RECOMMENDATIONS: Anne [name] is a 24 y.o. female whose past medical history and cardiac risk factors include: 17th week pregnancy, former smoker.  Palpitations:  Symptoms have decreased in intensity, frequency, and duration.  Work-up thus far has included 24-hour Holter monitor and 2D echocardiogram results of which are noted above for further reference.  We will plan surface echocardiogram to evaluate LV function for further with stratification.  If the symptoms are progressive consider metoprolol tartrate.  However I did inform the patient that per medical literature caution is advised during pregnancy especially the second and third trimester.  The risk of teratogenicity not expected based on the limited human data; possible risk for intrauterine growth restriction and neonatal adverse effect including bradycardia and hypoglycemia based on the limited or conflicting human data.  I have asked her to discuss the use of beta-blocker therapy with her OB/GYN at the next office visit.  Premature ventricular contractions: See above  Lightheaded/dizziness:  Stable.  She is asked to change positions slowly.   Compression stockings  may also help her symptoms.  Consider increasing salt intake in her diet.  Hemoglobin is currently being followed by her OB as she has had a history of iron infusions.   Educated on implementing these nonpharmacological therapies to help improve her lightheadedness and dizziness.    Former smoker: Educated on the importance of continued smoking cessation.  Plan of care discussed with the patient and she finds it acceptable.  FINAL MEDICATION LIST END OF ENCOUNTER: No orders of the defined types were placed in this encounter.   There are no discontinued medications.   Current Outpatient Medications:  .  albuterol (PROVENTIL HFA;VENTOLIN HFA) 108 (90 Base) MCG/ACT inhaler, Inhale 1-2 puffs into the lungs every 6 (six) hours as needed for wheezing or shortness of breath., Disp: , Rfl:  .  Prenatal Vit-Fe Fumarate-FA (PRENATAL MULTIVITAMIN) TABS tablet, Take 1 tablet by mouth daily at 12 noon., Disp: , Rfl:   No orders of the defined types were placed in this encounter.   There are no Patient Instructions on file for this visit.   --Continue cardiac medications as reconciled in final medication list. --No follow-ups on file. Or sooner if needed. --Continue follow-up with your primary care physician regarding the management of your other chronic comorbid conditions.  Patient's questions and concerns were addressed to her satisfaction. She voices understanding of the instructions provided during this encounter.   This note was created using a voice recognition software as a result there may be grammatical errors inadvertently enclosed that do not reflect the nature of this encounter. Every attempt is made to correct such errors.  Rex Kras, [place], [facility]  Pager: [phone] Office: [phone]

## 2020-01-21 ENCOUNTER — Ambulatory Visit: Payer: Medicaid Other | Admitting: Cardiology

## 2020-02-10 ENCOUNTER — Inpatient Hospital Stay (HOSPITAL_COMMUNITY)
Admission: AD | Admit: 2020-02-10 | Discharge: 2020-02-10 | Disposition: A | Discharge status: Home / Self Care | Payer: Medicaid Other | Attending: Obstetrics and Gynecology | Admitting: Obstetrics and Gynecology

## 2020-02-10 ENCOUNTER — Encounter (HOSPITAL_COMMUNITY): Payer: Self-pay | Admitting: Obstetrics and Gynecology

## 2020-02-10 ENCOUNTER — Inpatient Hospital Stay (HOSPITAL_COMMUNITY): Payer: Medicaid Other

## 2020-02-10 ENCOUNTER — Other Ambulatory Visit: Payer: Self-pay

## 2020-02-10 DIAGNOSIS — Z87891 Personal history of nicotine dependence: Secondary | ICD-10-CM | POA: Diagnosis not present

## 2020-02-10 DIAGNOSIS — O99613 Diseases of the digestive system complicating pregnancy, third trimester: Secondary | ICD-10-CM | POA: Diagnosis not present

## 2020-02-10 DIAGNOSIS — R05 Cough: Secondary | ICD-10-CM | POA: Insufficient documentation

## 2020-02-10 DIAGNOSIS — R079 Chest pain, unspecified: Secondary | ICD-10-CM | POA: Diagnosis not present

## 2020-02-10 DIAGNOSIS — Z3A33 33 weeks gestation of pregnancy: Secondary | ICD-10-CM | POA: Insufficient documentation

## 2020-02-10 DIAGNOSIS — Z87440 Personal history of urinary (tract) infections: Secondary | ICD-10-CM | POA: Diagnosis not present

## 2020-02-10 DIAGNOSIS — Z20822 Contact with and (suspected) exposure to covid-19: Secondary | ICD-10-CM | POA: Insufficient documentation

## 2020-02-10 DIAGNOSIS — K219 Gastro-esophageal reflux disease without esophagitis: Secondary | ICD-10-CM | POA: Insufficient documentation

## 2020-02-10 DIAGNOSIS — O99891 Other specified diseases and conditions complicating pregnancy: Secondary | ICD-10-CM

## 2020-02-10 DIAGNOSIS — Z8249 Family history of ischemic heart disease and other diseases of the circulatory system: Secondary | ICD-10-CM | POA: Diagnosis not present

## 2020-02-10 DIAGNOSIS — Z8349 Family history of other endocrine, nutritional and metabolic diseases: Secondary | ICD-10-CM | POA: Insufficient documentation

## 2020-02-10 DIAGNOSIS — R110 Nausea: Secondary | ICD-10-CM | POA: Insufficient documentation

## 2020-02-10 DIAGNOSIS — J45909 Unspecified asthma, uncomplicated: Secondary | ICD-10-CM | POA: Insufficient documentation

## 2020-02-10 DIAGNOSIS — R42 Dizziness and giddiness: Secondary | ICD-10-CM | POA: Diagnosis not present

## 2020-02-10 HISTORY — DX: Anemia, unspecified: D64.9

## 2020-02-10 HISTORY — DX: Unspecified infectious disease: B99.9

## 2020-02-10 LAB — URINALYSIS, ROUTINE W REFLEX MICROSCOPIC
Bilirubin Urine: NEGATIVE
Glucose, UA: NEGATIVE mg/dL
Hgb urine dipstick: NEGATIVE
Ketones, ur: NEGATIVE mg/dL
Nitrite: NEGATIVE
Protein, ur: NEGATIVE mg/dL
Specific Gravity, Urine: 1.006 (ref 1.005–1.030)
pH: 7 (ref 5.0–8.0)

## 2020-02-10 LAB — CBC WITH DIFFERENTIAL/PLATELET
Abs Immature Granulocytes: 0.04 10*3/uL (ref 0.00–0.07)
Basophils Absolute: 0 10*3/uL (ref 0.0–0.1)
Basophils Relative: 0 %
Eosinophils Absolute: 0.1 10*3/uL (ref 0.0–0.5)
Eosinophils Relative: 1 %
HCT: 29.4 % — ABNORMAL LOW (ref 36.0–46.0)
Hemoglobin: 9.2 g/dL — ABNORMAL LOW (ref 12.0–15.0)
Immature Granulocytes: 1 %
Lymphocytes Relative: 21 %
Lymphs Abs: 1.8 10*3/uL (ref 0.7–4.0)
MCH: 28.5 pg (ref 26.0–34.0)
MCHC: 31.3 g/dL (ref 30.0–36.0)
MCV: 91 fL (ref 80.0–100.0)
Monocytes Absolute: 0.6 10*3/uL (ref 0.1–1.0)
Monocytes Relative: 7 %
Neutro Abs: 6.1 10*3/uL (ref 1.7–7.7)
Neutrophils Relative %: 70 %
Platelets: 234 10*3/uL (ref 150–400)
RBC: 3.23 MIL/uL — ABNORMAL LOW (ref 3.87–5.11)
RDW: 12.6 % (ref 11.5–15.5)
WBC: 8.6 10*3/uL (ref 4.0–10.5)
nRBC: 0 % (ref 0.0–0.2)

## 2020-02-10 LAB — RESPIRATORY PANEL BY RT PCR (FLU A&B, COVID)
Influenza A by PCR: NEGATIVE
Influenza B by PCR: NEGATIVE
SARS Coronavirus 2 by RT PCR: NEGATIVE

## 2020-02-10 LAB — TROPONIN I (HIGH SENSITIVITY): Troponin I (High Sensitivity): 5 ng/L (ref <18)

## 2020-02-10 MED ORDER — SODIUM CHLORIDE 0.9 % NICU IV INFUSION SIMPLE
INJECTION | INTRAVENOUS | Status: DC
  Filled 2020-02-10 (×7): qty 500

## 2020-02-10 MED ORDER — PANTOPRAZOLE SODIUM 20 MG PO TBEC: 20.0000 mg | DELAYED_RELEASE_TABLET | Freq: Every day | ORAL | 1 refills | Status: DC

## 2020-02-10 MED ORDER — SODIUM CHLORIDE 0.9 % IV SOLN
510.0000 mg | Freq: Once | INTRAVENOUS | Status: AC
  Administered 2020-02-10: 510 mg via INTRAVENOUS
  Filled 2020-02-10: qty 17

## 2020-02-10 MED ORDER — IOHEXOL 350 MG/ML SOLN
50.0000 mL | Freq: Once | INTRAVENOUS | Status: AC | PRN
  Administered 2020-02-10: 50 mL via INTRAVENOUS

## 2020-02-10 MED ORDER — FAMOTIDINE 20 MG PO TABS
40.0000 mg | ORAL_TABLET | Freq: Once | ORAL | Status: AC
  Administered 2020-02-10: 40 mg via ORAL
  Filled 2020-02-10: qty 2

## 2020-02-10 NOTE — MAU Note (Signed)
Presents with c/o chest pain that located in the middle of chest and right side that began last night.  Reports feels like pulled muscle, hasn't taken any meds for pain.  Also c/o of dizziness and nausea, hasn't vomited.  Nausea began this morning.  Denies LOF or VB.  Reports +FM, less than usual.

## 2020-02-10 NOTE — MAU Provider Note (Signed)
History     CSN: [id number]  Arrival date and time: 02/10/20 1041   First Provider Initiated Contact with Patient 02/10/20 1146      Chief Complaint  Patient presents with  . Nausea  . Chest Pain  . Dizziness   HPI  [name] is a 24 y.o. female 533P2002 @ 4960w5d here with chest pain and dizziness. She has a history of dizziness and has a cardiologist. She has an appt with them scheduled next week. She reports feeling chest pain starting at 9:00 pm last night. At midnight it started feeling like she could not take a deep breath in because it felt like she had a rubber band around her chest. No SOB. She can take deep breaths, however it hurts to breath deep. + cough over the weekend. No radiation of chest pain. Declined the Covid Vaccine.  She reports chest pain of 3/10.   Dinner last night she ate [employer] rolls; no snack after dinner.  Hx of exercised induced asthma, not using inhaler.   OB History    Gravida  3   Para  2   Term  2   Preterm      AB      Living  2     SAB      TAB      Ectopic      Multiple  0   Live Births  2           Past Medical History:  Diagnosis Date  . Anemia   . Anxiety   . Asthma   . Depression    doing good  . History of cystitis   . Infection    UTI  . Ovarian cyst   . Ovarian cyst    "ruptured in 3rd grade"    Past Surgical History:  Procedure Laterality Date  . NO PAST SURGERIES      Family History  Problem Relation Age of Onset  . Depression Mother   . Anxiety disorder Mother   . Headache Father   . Depression Maternal Grandmother   . Anxiety disorder Maternal Grandmother   . Thyroid disease Maternal Grandmother   . Stroke Maternal Grandmother   . Hypertension Maternal Grandmother   . Depression Maternal Grandfather   . Anxiety disorder Maternal Grandfather   . Stroke Maternal Grandfather   . Hypertension Maternal Grandfather   . Heart disease Maternal Grandfather   . Melanoma Paternal Grandmother    . Hypertension Paternal Grandfather   . Heart disease Paternal Grandfather     Social History   Tobacco Use  . Smoking status: Former Games developermoker  . Smokeless tobacco: Never Used  . Tobacco comment: quit in Jan'21  Vaping Use  . Vaping Use: Former  Substance Use Topics  . Alcohol use: Not Currently    Alcohol/week: 0.0 standard drinks  . Drug use: No    Allergies: No Known Allergies  Medications Prior to Admission  Medication Sig Dispense Refill Last Dose  . calcium carbonate (TUMS - DOSED IN MG ELEMENTAL CALCIUM) 500 MG chewable tablet Chew 1 tablet by mouth daily.   02/10/2020 at Unknown time  . Prenatal Vit-Fe Fumarate-FA (PRENATAL MULTIVITAMIN) TABS tablet Take 1 tablet by mouth daily at 12 noon.    Past Week at Unknown time  . albuterol (PROVENTIL HFA;VENTOLIN HFA) 108 (90 Base) MCG/ACT inhaler Inhale 1-2 puffs into the lungs every 6 (six) hours as needed for wheezing or shortness of breath.  More than a month at Unknown time   Results for orders placed or performed during the hospital encounter of 02/10/20 (from the past 48 hour(s))  Urinalysis, Routine w reflex microscopic Urine, Clean Catch     Status: Abnormal   Collection Time: 02/10/20 11:24 AM  Result Value Ref Range   Color, Urine YELLOW YELLOW   APPearance HAZY (A) CLEAR   Specific Gravity, Urine 1.006 1.005 - 1.030   pH 7.0 5.0 - 8.0   Glucose, UA NEGATIVE NEGATIVE mg/dL   Hgb urine dipstick NEGATIVE NEGATIVE   Bilirubin Urine NEGATIVE NEGATIVE   Ketones, ur NEGATIVE NEGATIVE mg/dL   Protein, ur NEGATIVE NEGATIVE mg/dL   Nitrite NEGATIVE NEGATIVE   Leukocytes,Ua TRACE (A) NEGATIVE   RBC / HPF 0-5 0 - 5 RBC/hpf   WBC, UA 0-5 0 - 5 WBC/hpf   Bacteria, UA RARE (A) NONE SEEN   Squamous Epithelial / LPF 0-5 0 - 5   Mucus PRESENT    Amorphous Crystal PRESENT     Comment: Performed at [facility], [address], [place], [place] [address]  Respiratory Panel by RT PCR (Flu A&B, Covid) - Nasopharyngeal Swab      Status: None   Collection Time: 02/10/20 12:05 PM   Specimen: Nasopharyngeal Swab  Result Value Ref Range   SARS Coronavirus 2 by RT PCR NEGATIVE NEGATIVE    Comment: (NOTE) SARS-CoV-2 target nucleic acids are NOT DETECTED.  The SARS-CoV-2 RNA is generally detectable in upper respiratoy specimens during the acute phase of infection. The lowest concentration of SARS-CoV-2 viral copies this assay can detect is 131 copies/mL. A negative result does not preclude SARS-Cov-2 infection and should not be used as the sole basis for treatment or other patient management decisions. A negative result may occur with  improper specimen collection/handling, submission of specimen other than nasopharyngeal swab, presence of viral mutation(s) within the areas targeted by this assay, and inadequate number of viral copies (<131 copies/mL). A negative result must be combined with clinical observations, patient history, and epidemiological information. The expected result is Negative.  Fact Sheet for Patients:  https://www.moore.com/  Fact Sheet for Healthcare Providers:  https://www.young.biz/  This test is no t yet approved or cleared by the [place] FDA and  has been authorized for detection and/or diagnosis of SARS-CoV-2 by FDA under an Emergency Use Authorization (EUA). This EUA will remain  in effect (meaning this test can be used) for the duration of the COVID-19 declaration under Section 564(b)(1) of the Act, 21 U.S.C. section 360bbb-3(b)(1), unless the authorization is terminated or revoked sooner.     Influenza A by PCR NEGATIVE NEGATIVE   Influenza B by PCR NEGATIVE NEGATIVE    Comment: (NOTE) The Xpert Xpress SARS-CoV-2/FLU/RSV assay is intended as an aid in  the diagnosis of influenza from Nasopharyngeal swab specimens and  should not be used as a sole basis for treatment. Nasal washings and  aspirates are unacceptable for Xpert Xpress  SARS-CoV-2/FLU/RSV  testing.  Fact Sheet for Patients: https://www.moore.com/  Fact Sheet for Healthcare Providers: https://www.young.biz/  This test is not yet approved or cleared by the [place] FDA and  has been authorized for detection and/or diagnosis of SARS-CoV-2 by  FDA under an Emergency Use Authorization (EUA). This EUA will remain  in effect (meaning this test can be used) for the duration of the  Covid-19 declaration under Section 564(b)(1) of the Act, 21  U.S.C. section 360bbb-3(b)(1), unless the authorization is  terminated  or revoked. Performed at [facility], [address] [place], [place] [address]   Troponin I (High Sensitivity)     Status: None   Collection Time: 02/10/20 12:21 PM  Result Value Ref Range   Troponin I (High Sensitivity) 5 <18 ng/L    Comment: (NOTE) Elevated high sensitivity troponin I (hsTnI) values and significant  changes across serial measurements may suggest ACS but many other  chronic and acute conditions are known to elevate hsTnI results.  Refer to the "Links" section for chest pain algorithms and additional  guidance. Performed at [facility], [address], [place], [place] [address]   CBC with Differential/Platelet     Status: Abnormal   Collection Time: 02/10/20 12:21 PM  Result Value Ref Range   WBC 8.6 4.0 - 10.5 K/uL   RBC 3.23 (L) 3.87 - 5.11 MIL/uL   Hemoglobin 9.2 (L) 12.0 - 15.0 g/dL   HCT 97.6 (L) 36 - 46 %   MCV 91.0 80.0 - 100.0 fL   MCH 28.5 26.0 - 34.0 pg   MCHC 31.3 30.0 - 36.0 g/dL   RDW 73.4 19.3 - 79.0 %   Platelets 234 150 - 400 K/uL   nRBC 0.0 0.0 - 0.2 %   Neutrophils Relative % 70 %   Neutro Abs 6.1 1.7 - 7.7 K/uL   Lymphocytes Relative 21 %   Lymphs Abs 1.8 0.7 - 4.0 K/uL   Monocytes Relative 7 %   Monocytes Absolute 0.6 0 - 1 K/uL   Eosinophils Relative 1 %   Eosinophils Absolute 0.1 0 - 0 K/uL   Basophils Relative 0 %   Basophils  Absolute 0.0 0 - 0 K/uL   Immature Granulocytes 1 %   Abs Immature Granulocytes 0.04 0.00 - 0.07 K/uL    Comment: Performed at [facility], [address], [place], [place] [address]   CT ANGIO CHEST PE W OR WO CONTRAST  Result Date: 02/10/2020 CLINICAL DATA:  Suspected pulmonary embolism, shortness of breath EXAM: CT ANGIOGRAPHY CHEST WITH CONTRAST TECHNIQUE: Multidetector CT imaging of the chest was performed using the standard protocol during bolus administration of intravenous contrast. Multiplanar CT image reconstructions and MIPs were obtained to evaluate the vascular anatomy. CONTRAST:  18mL OMNIPAQUE IOHEXOL 350 MG/ML SOLN COMPARISON:  None FINDINGS: Cardiovascular: Smooth aortic contour. Normal heart size. No pericardial effusion. No sign of pulmonary embolism to the segmental level. Study limited by bolus timing. Mediastinum/Nodes: Residual thymic tissue in the anterior mediastinum. No adenopathy in the chest. Esophagus grossly normal. Lungs/Pleura: No consolidation. No pleural effusion. Airways are patent. Upper Abdomen: Imaged portions of liver, gallbladder and spleen are unremarkable. The pancreas incompletely imaged. No acute process in the upper abdomen. Musculoskeletal: No acute bone finding. No destructive bone process. Review of the MIP images confirms the above findings. IMPRESSION: Mildly limited by bolus timing, no sign of embolus to the segmental level. No acute cardiopulmonary process. Electronically Signed   By: Donzetta Kohut M.D.   On: 02/10/2020 18:54    Review of Systems  Constitutional: Negative for fever.  HENT: Negative for congestion.   Respiratory: Negative for shortness of breath.   Cardiovascular: Positive for chest pain.  Gastrointestinal: Positive for nausea. Negative for abdominal pain and vomiting.  Genitourinary: Negative for vaginal bleeding and vaginal discharge.  Neurological: Positive for dizziness.   Physical Exam   Blood pressure 113/66, pulse  (!) 109, temperature 98.6 F (37 C), temperature source Oral, resp. rate  20, height 5\' 1"  (1.549 m), weight 70.7 kg, last menstrual period 03/04/2019, SpO2 98 %, unknown if currently breastfeeding.  Patient Vitals for the past 24 hrs:  BP Temp Temp src Pulse Resp SpO2 Height Weight  02/10/20 1921 104/65 98.1 F (36.7 C) Oral 85 18 -- -- --  02/10/20 1900 -- -- -- -- -- 99 % -- --  02/10/20 1859 (!) 96/57 -- -- 80 16 -- -- --  02/10/20 1801 (!) 86/49 -- -- 77 16 -- -- --  02/10/20 1800 -- -- -- -- -- 100 % -- --  02/10/20 1605 -- -- -- -- -- 99 % -- --  02/10/20 1600 -- -- -- -- -- 99 % -- --  02/10/20 1545 -- -- -- -- -- 100 % -- --  02/10/20 1505 -- -- -- -- -- 98 % -- --  02/10/20 1455 -- -- -- -- -- 98 % -- --  02/10/20 1453 105/64 -- -- 89 -- -- -- --  02/10/20 1425 106/65 -- -- 93 16 100 % -- --  02/10/20 1420 -- -- -- -- -- 100 % -- --  02/10/20 1309 (!) 90/49 -- -- 85 16 -- -- --  02/10/20 1056 113/66 98.6 F (37 C) Oral (!) 109 20 98 % 5\' 1"  (1.549 m) 70.7 kg   Physical Exam Constitutional:      General: She is not in acute distress.    Appearance: Normal appearance. She is normal weight. She is not ill-appearing, toxic-appearing or diaphoretic.  HENT:     Head: Normocephalic.     Nose: Nose normal.  Eyes:     Pupils: Pupils are equal, round, and reactive to light.  Cardiovascular:     Rate and Rhythm: Normal rate and regular rhythm.     Heart sounds: Normal heart sounds.  Pulmonary:     Effort: Pulmonary effort is normal.     Breath sounds: Normal breath sounds.  Musculoskeletal:     Cervical back: Normal range of motion.  Skin:    General: Skin is warm.  Neurological:     General: No focal deficit present.     Mental Status: She is alert.  Psychiatric:        Mood and Affect: Mood normal.    Fetal Tracing: Baseline: 135 bpm Variability: Moderate  Accelerations: 15x15 Decelerations: None Toco: None  MAU Course  Procedures  None  MDM Pepcid given  40 mg PO with no improvement of chest pain initially Labs stable: Hgb 9.2 which could be contributing to the dizziness. Fereheme given IV x 1 dose Covid negative  PE negative for PE Cardiology reviewed EKG: Dr. 02/12/20, Non specific changes but no immediate concern.  Patient without pain at this time. She is eating and drinking   Assessment and Plan   A:  1. Gastroesophageal reflux disease, unspecified whether esophagitis present   2. Chest pain, unspecified type   3. [redacted] weeks gestation of pregnancy     P:  Discharge home in stable condition Rx: Protonix Avoid spicy foods Keep your cardiology appointment next week Return to MAU if symptoms worsen  Andrik Sandt, , NP 02/10/2020 10:03 PM

## 2020-02-10 NOTE — Discharge Instructions (Signed)

## 2020-02-10 NOTE — MAU Note (Signed)
Chest pain is starting to come back, "muscular", comes and goes, "when she really focuses and thinks about it, it stops".

## 2020-02-11 LAB — CULTURE, OB URINE: Culture: NO GROWTH

## 2020-02-18 ENCOUNTER — Ambulatory Visit: Payer: Medicaid Other | Admitting: Cardiology

## 2020-02-18 ENCOUNTER — Encounter: Payer: Self-pay | Admitting: Cardiology

## 2020-02-18 ENCOUNTER — Other Ambulatory Visit: Payer: Self-pay

## 2020-02-18 VITALS — BP 109/68 | HR 95 | Ht 61.0 in | Wt 154.0 lb

## 2020-02-18 DIAGNOSIS — Z87891 Personal history of nicotine dependence: Secondary | ICD-10-CM

## 2020-02-18 DIAGNOSIS — R002 Palpitations: Secondary | ICD-10-CM

## 2020-02-18 DIAGNOSIS — Z3A33 33 weeks gestation of pregnancy: Secondary | ICD-10-CM

## 2020-02-18 DIAGNOSIS — I493 Ventricular premature depolarization: Secondary | ICD-10-CM

## 2020-02-18 NOTE — Progress Notes (Signed)
Date:  02/18/2020   ID:  Anne [name], DOB 1995/10/15, MRN [id number]  PCP:  Berkley Harvey, NP  Cardiologist:  Rex Kras, DO, [facility] (established care 10/28/2019)  Chief Complaint  Patient presents with  . Palpitations  . Follow-up    HPI  Anne [name] is a 24 y.o. female who presents to the office with a chief complaint of "review test results and reevaluation of palpitations." Patient's past medical history and cardiac risk factors include: former smoker.   She was referred to the office back in June 2021 at the request of her primary care provider for evaluation of palpitations during pregnancy.    Since last visit patient states that her symptoms of palpitation have resolved.  No recurrent episodes of near syncope or syncopal events.  She is consuming 3 balanced meals and keeping herself well-hydrated.  Her work-up her palpitations have included a 24-hour Holter monitor as well as an echocardiogram.  The 24-hour Holter monitor did note the patient having a PVC burden of approximately 19.7%.  No other dysrhythmias noted.  We discussed initiating beta-blocker therapy; however, the shared decision was to hold off pharmacological therapy as she was pregnant and she wanted to try conservative management.  She still feels the same and luckily her symptoms of palpitation have resolved.    Since last visit she also underwent an echocardiogram which notes a preserved left ventricular systolic function, no regional wall motion abnormalities, normal diastolic filling pattern, and no significant valvular heart disease.   Since last visit patient states that she had an episode of difficulty in breathing and was concerned and went to the ER for further evaluation and management.  Patient states that her troponins were negative and CT of the chest was negative for pulmonary embolism as well.  The symptoms have not reoccurred.  No family history of premature coronary disease or sudden cardiac  death.  Denies prior history of coronary artery disease, myocardial infarction, congestive heart failure, deep venous thrombosis, pulmonary embolism, stroke, transient ischemic attack.  ALLERGIES: No Known Allergies  MEDICATION LIST PRIOR TO VISIT: Current Meds  Medication Sig  . albuterol (PROVENTIL HFA;VENTOLIN HFA) 108 (90 Base) MCG/ACT inhaler Inhale 1-2 puffs into the lungs every 6 (six) hours as needed for wheezing or shortness of breath.   . calcium carbonate (TUMS - DOSED IN MG ELEMENTAL CALCIUM) 500 MG chewable tablet Chew 1 tablet by mouth daily.  . pantoprazole (PROTONIX) 20 MG tablet Take 1 tablet (20 mg total) by mouth daily.  . Prenatal Vit-Fe Fumarate-FA (PRENATAL MULTIVITAMIN) TABS tablet Take 1 tablet by mouth daily at 12 noon.      PAST MEDICAL HISTORY: Past Medical History:  Diagnosis Date  . Anemia   . Anxiety   . Asthma   . Depression    doing good  . History of cystitis   . Infection    UTI  . Ovarian cyst   . Ovarian cyst    "ruptured in 3rd grade"    PAST SURGICAL HISTORY: Past Surgical History:  Procedure Laterality Date  . NO PAST SURGERIES      FAMILY HISTORY: The patient family history includes Anxiety disorder in her maternal grandfather, maternal grandmother, and mother; Depression in her maternal grandfather, maternal grandmother, and mother; Headache in her father; Heart disease in her maternal grandfather and paternal grandfather; Hypertension in her maternal grandfather, maternal grandmother, and paternal grandfather; Melanoma in her paternal grandmother; Stroke in her maternal grandfather and maternal grandmother; Thyroid  disease in her maternal grandmother.  SOCIAL HISTORY:  The patient  reports that she has quit smoking. She has never used smokeless tobacco. She reports previous alcohol use. She reports that she does not use drugs.  REVIEW OF SYSTEMS: Review of Systems  Constitutional: Negative for chills and fever.  HENT: Negative  for hoarse voice and nosebleeds.   Eyes: Negative for discharge, double vision and pain.  Cardiovascular: Negative for chest pain, claudication, dyspnea on exertion, leg swelling, near-syncope, orthopnea, palpitations, paroxysmal nocturnal dyspnea and syncope.  Respiratory: Negative for hemoptysis and shortness of breath.   Musculoskeletal: Negative for muscle cramps and myalgias.  Gastrointestinal: Negative for abdominal pain, constipation, diarrhea, hematemesis, hematochezia, melena, nausea and vomiting.  Neurological: Negative for dizziness and light-headedness.    PHYSICAL EXAM: Vitals with BMI 02/18/2020 02/10/2020 02/10/2020  Height '5\' 1"'  - -  Weight 154 lbs - -  BMI 78.93 - -  Systolic 810 175 96  Diastolic 68 65 57  Pulse 95 85 80   CONSTITUTIONAL: Well-developed and well-nourished. No acute distress.  SKIN: Skin is warm and dry. No rash noted. No cyanosis. No pallor. No jaundice HEAD: Normocephalic and atraumatic.  EYES: No scleral icterus MOUTH/THROAT: Moist oral membranes.  NECK: No JVD present. No thyromegaly noted. No carotid bruits  LYMPHATIC: No visible cervical adenopathy.  CHEST Normal respiratory effort. No intercostal retractions  LUNGS: Clear to auscultation bilaterally.  No stridor. No wheezes. No rales.  CARDIOVASCULAR: Regular rate and rhythm, positive S1-S2, no murmurs rubs or gallops appreciated ABDOMINAL: Gravid uterus.  No apparent ascites.  EXTREMITIES: Trace peripheral edema  HEMATOLOGIC: No significant bruising NEUROLOGIC: Oriented to person, place, and time. Nonfocal. Normal muscle tone.  PSYCHIATRIC: Normal mood and affect. Normal behavior. Cooperative  CARDIAC DATABASE: EKG: 10/28/2019: Normal sinus rhythm, 88 bpm, normal axis, IRBBB, baseline artifact in multiple leads.   Echocardiogram: 12/04/2019: Normal LV systolic function with visual EF 50-55%. No obvious regional wall motion abnormalities. Left ventricle cavity is normal in size. Normal wall  thickness. Normal global wall motion. Normal diastolic filling pattern, normal LAP.  Mild tricuspid regurgitation. No evidence of pulmonary hypertension.  Mild pulmonic regurgitation.  IVC is normal with a respiratory response of <50%.  No prior study for comparison.  Stress Testing:  None  Heart Catheterization: None  24 hour Holter monitor: Dominant rhythm normal sinus rhythm. Heart rate 66-165 bpm.  Average heart rate 89bpm. No atrial fibrillation/atrial flutter/high grade AV block, sinus pause greater than or equal to 3 seconds in duration. Ventricular ectopy was 26,363 and total ventricular ectopic burden 19.7%. Total supraventricular ectopic burden 0%. Number of patient triggered events: 0.   LABORATORY DATA: External labs: Collected: 10/03/2019 Creatinine 0.52 mg/dL. eGFR: > 90 mL/min per 1.73 m TSH: 1.34  Hemoglobin 13.5 g/dL as of August 26, 2019 (outside records).  IMPRESSION:    ICD-10-CM   1. Palpitations  R00.2   2. PVC (premature ventricular contraction)  I49.3   3. Former smoker  Z87.891   4. [redacted] weeks gestation of pregnancy  Z3A.33      RECOMMENDATIONS: Anne [name] is a 24 y.o. female whose past medical history and cardiac risk factors include: 17th week pregnancy, former smoker.  Palpitations:  Symptoms of palpitation have essentially resolved.  She did undergo a 24-hour Holter monitor earlier in pregnancy and was noted to have a PVC burden.  Since her symptoms were improving patient chose not to start pharmacological therapy which was very appropriate.   Since last visit she  also underwent an echocardiogram which notes preserved LVEF, normal diastolic function, and no significant valve disease.  These findings were conveyed to her in great detail in layman's terms.  Premature ventricular contractions: Monitor for now.  Lightheaded/dizziness: Resolved.    Former smoker: Educated on the importance of continued smoking cessation.  Independently  reviewed the reports of the echo as well as recent hospital notes, labs, CT results.  FINAL MEDICATION LIST END OF ENCOUNTER: No orders of the defined types were placed in this encounter.   There are no discontinued medications.   Current Outpatient Medications:  .  albuterol (PROVENTIL HFA;VENTOLIN HFA) 108 (90 Base) MCG/ACT inhaler, Inhale 1-2 puffs into the lungs every 6 (six) hours as needed for wheezing or shortness of breath. , Disp: , Rfl:  .  calcium carbonate (TUMS - DOSED IN MG ELEMENTAL CALCIUM) 500 MG chewable tablet, Chew 1 tablet by mouth daily., Disp: , Rfl:  .  pantoprazole (PROTONIX) 20 MG tablet, Take 1 tablet (20 mg total) by mouth daily., Disp: 30 tablet, Rfl: 1 .  Prenatal Vit-Fe Fumarate-FA (PRENATAL MULTIVITAMIN) TABS tablet, Take 1 tablet by mouth daily at 12 noon. , Disp: , Rfl:   No orders of the defined types were placed in this encounter.  There are no Patient Instructions on file for this visit.   --Continue cardiac medications as reconciled in final medication list. --Return in about 5 months (around 07/18/2020) for Reevaluation of symptoms s/p postpartum. Or sooner if needed. --Continue follow-up with your primary care physician regarding the management of your other chronic comorbid conditions.  Patient's questions and concerns were addressed to her satisfaction. She voices understanding of the instructions provided during this encounter.   This note was created using a voice recognition software as a result there may be grammatical errors inadvertently enclosed that do not reflect the nature of this encounter. Every attempt is made to correct such errors.  Rex Kras, [place], [facility]  Pager: [phone] Office: [phone]

## 2020-02-21 LAB — OB RESULTS CONSOLE GBS: GBS: NEGATIVE

## 2020-03-26 ENCOUNTER — Telehealth (HOSPITAL_COMMUNITY): Payer: Self-pay | Admitting: *Deleted

## 2020-03-26 NOTE — Telephone Encounter (Signed)
Preadmission screen  

## 2020-03-30 ENCOUNTER — Other Ambulatory Visit (HOSPITAL_COMMUNITY)
Admission: RE | Admit: 2020-03-30 | Discharge: 2020-03-30 | Disposition: A | Discharge status: Home / Self Care | Payer: BC Managed Care – PPO | Source: Ambulatory Visit | Attending: Obstetrics & Gynecology | Admitting: Obstetrics & Gynecology

## 2020-03-30 ENCOUNTER — Encounter (HOSPITAL_COMMUNITY): Payer: Self-pay | Admitting: *Deleted

## 2020-03-30 ENCOUNTER — Telehealth (HOSPITAL_COMMUNITY): Payer: Self-pay | Admitting: *Deleted

## 2020-03-30 DIAGNOSIS — Z20822 Contact with and (suspected) exposure to covid-19: Secondary | ICD-10-CM | POA: Insufficient documentation

## 2020-03-30 DIAGNOSIS — Z01812 Encounter for preprocedural laboratory examination: Secondary | ICD-10-CM | POA: Insufficient documentation

## 2020-03-30 LAB — SARS CORONAVIRUS 2 (TAT 6-24 HRS): SARS Coronavirus 2: NEGATIVE

## 2020-03-30 NOTE — Telephone Encounter (Signed)
Preadmission screen  

## 2020-03-31 ENCOUNTER — Inpatient Hospital Stay (HOSPITAL_COMMUNITY)
Admission: AD | Admit: 2020-03-31 | Discharge: 2020-04-02 | DRG: 807 | Disposition: A | Discharge status: Home / Self Care | Payer: BC Managed Care – PPO | Attending: Obstetrics & Gynecology | Admitting: Obstetrics & Gynecology

## 2020-03-31 ENCOUNTER — Inpatient Hospital Stay (HOSPITAL_COMMUNITY): Payer: BC Managed Care – PPO

## 2020-03-31 ENCOUNTER — Encounter (HOSPITAL_COMMUNITY): Payer: Self-pay | Admitting: Obstetrics & Gynecology

## 2020-03-31 DIAGNOSIS — O26893 Other specified pregnancy related conditions, third trimester: Secondary | ICD-10-CM | POA: Diagnosis present

## 2020-03-31 DIAGNOSIS — R000 Tachycardia, unspecified: Secondary | ICD-10-CM | POA: Diagnosis present

## 2020-03-31 DIAGNOSIS — O9952 Diseases of the respiratory system complicating childbirth: Secondary | ICD-10-CM | POA: Diagnosis present

## 2020-03-31 DIAGNOSIS — Z3A41 41 weeks gestation of pregnancy: Secondary | ICD-10-CM | POA: Diagnosis not present

## 2020-03-31 DIAGNOSIS — J45909 Unspecified asthma, uncomplicated: Secondary | ICD-10-CM | POA: Diagnosis present

## 2020-03-31 DIAGNOSIS — Z87891 Personal history of nicotine dependence: Secondary | ICD-10-CM | POA: Diagnosis not present

## 2020-03-31 DIAGNOSIS — O480 Post-term pregnancy: Principal | ICD-10-CM | POA: Diagnosis present

## 2020-03-31 DIAGNOSIS — R002 Palpitations: Secondary | ICD-10-CM | POA: Diagnosis present

## 2020-03-31 DIAGNOSIS — Z20822 Contact with and (suspected) exposure to covid-19: Secondary | ICD-10-CM | POA: Diagnosis present

## 2020-03-31 DIAGNOSIS — Z3490 Encounter for supervision of normal pregnancy, unspecified, unspecified trimester: Secondary | ICD-10-CM

## 2020-03-31 LAB — CBC
HCT: 35.6 % — ABNORMAL LOW (ref 36.0–46.0)
HCT: 29.9 % — ABNORMAL LOW (ref 36.0–46.0)
Hemoglobin: 11.5 g/dL — ABNORMAL LOW (ref 12.0–15.0)
Hemoglobin: 10 g/dL — ABNORMAL LOW (ref 12.0–15.0)
MCH: 28.9 pg (ref 26.0–34.0)
MCH: 29.7 pg (ref 26.0–34.0)
MCHC: 32.3 g/dL (ref 30.0–36.0)
MCHC: 33.4 g/dL (ref 30.0–36.0)
MCV: 89.4 fL (ref 80.0–100.0)
MCV: 88.7 fL (ref 80.0–100.0)
Platelets: 226 10*3/uL (ref 150–400)
Platelets: 184 10*3/uL (ref 150–400)
RBC: 3.98 MIL/uL (ref 3.87–5.11)
RBC: 3.37 MIL/uL — ABNORMAL LOW (ref 3.87–5.11)
RDW: 14.5 % (ref 11.5–15.5)
RDW: 14.5 % (ref 11.5–15.5)
WBC: 9.1 10*3/uL (ref 4.0–10.5)
WBC: 10.6 10*3/uL — ABNORMAL HIGH (ref 4.0–10.5)
nRBC: 0 % (ref 0.0–0.2)
nRBC: 0 % (ref 0.0–0.2)

## 2020-03-31 LAB — TYPE AND SCREEN
ABO/RH(D): O POS
Antibody Screen: NEGATIVE

## 2020-03-31 LAB — RPR: RPR Ser Ql: NONREACTIVE

## 2020-03-31 MED ORDER — OXYTOCIN-SODIUM CHLORIDE 30-0.9 UT/500ML-% IV SOLN: 2.5000 [IU]/h | INTRAVENOUS | Status: DC

## 2020-03-31 MED ORDER — OXYCODONE-ACETAMINOPHEN 5-325 MG PO TABS: 1.0000 | ORAL_TABLET | ORAL | Status: DC | PRN

## 2020-03-31 MED ORDER — SIMETHICONE 80 MG PO CHEW: 80.0000 mg | CHEWABLE_TABLET | ORAL | Status: DC | PRN

## 2020-03-31 MED ORDER — SOD CITRATE-CITRIC ACID 500-334 MG/5ML PO SOLN: 30.0000 mL | ORAL | Status: DC | PRN

## 2020-03-31 MED ORDER — OXYCODONE-ACETAMINOPHEN 5-325 MG PO TABS: 2.0000 | ORAL_TABLET | ORAL | Status: DC | PRN

## 2020-03-31 MED ORDER — LACTATED RINGERS IV SOLN: INTRAVENOUS | Status: DC

## 2020-03-31 MED ORDER — TERBUTALINE SULFATE 1 MG/ML IJ SOLN: 0.2500 mg | Freq: Once | INTRAMUSCULAR | Status: DC | PRN

## 2020-03-31 MED ORDER — FENTANYL CITRATE (PF) 100 MCG/2ML IJ SOLN: 50.0000 ug | INTRAMUSCULAR | Status: DC | PRN

## 2020-03-31 MED ORDER — PHENYLEPHRINE 40 MCG/ML (10ML) SYRINGE FOR IV PUSH (FOR BLOOD PRESSURE SUPPORT): 80.0000 ug | PREFILLED_SYRINGE | INTRAVENOUS | Status: DC | PRN

## 2020-03-31 MED ORDER — COCONUT OIL OIL: 1.0000 "application " | TOPICAL_OIL | Status: DC | PRN

## 2020-03-31 MED ORDER — ONDANSETRON HCL 4 MG PO TABS: 4.0000 mg | ORAL_TABLET | ORAL | Status: DC | PRN

## 2020-03-31 MED ORDER — PRENATAL MULTIVITAMIN CH
1.0000 | ORAL_TABLET | Freq: Every day | ORAL | Status: DC
  Administered 2020-04-01 – 2020-04-02 (×2): 1 via ORAL
  Filled 2020-03-31 (×2): qty 1

## 2020-03-31 MED ORDER — DIBUCAINE (PERIANAL) 1 % EX OINT: 1.0000 "application " | TOPICAL_OINTMENT | CUTANEOUS | Status: DC | PRN

## 2020-03-31 MED ORDER — DIPHENHYDRAMINE HCL 25 MG PO CAPS
25.0000 mg | ORAL_CAPSULE | Freq: Four times a day (QID) | ORAL | Status: DC | PRN
  Administered 2020-04-01: 25 mg via ORAL
  Filled 2020-03-31: qty 1

## 2020-03-31 MED ORDER — OXYTOCIN-SODIUM CHLORIDE 30-0.9 UT/500ML-% IV SOLN
1.0000 m[IU]/min | INTRAVENOUS | Status: DC
  Administered 2020-03-31: 2 m[IU]/min via INTRAVENOUS
  Filled 2020-03-31: qty 500

## 2020-03-31 MED ORDER — LIDOCAINE HCL (PF) 1 % IJ SOLN
30.0000 mL | INTRAMUSCULAR | Status: AC | PRN
  Administered 2020-03-31: 30 mL via SUBCUTANEOUS
  Filled 2020-03-31: qty 30

## 2020-03-31 MED ORDER — ALBUTEROL SULFATE HFA 108 (90 BASE) MCG/ACT IN AERS
1.0000 | INHALATION_SPRAY | Freq: Four times a day (QID) | RESPIRATORY_TRACT | Status: DC | PRN
  Filled 2020-03-31: qty 6.7

## 2020-03-31 MED ORDER — EPHEDRINE 5 MG/ML INJ: 10.0000 mg | INTRAVENOUS | Status: DC | PRN

## 2020-03-31 MED ORDER — FENTANYL-BUPIVACAINE-NACL 0.5-0.125-0.9 MG/250ML-% EP SOLN: 12.0000 mL/h | EPIDURAL | Status: DC | PRN

## 2020-03-31 MED ORDER — ZOLPIDEM TARTRATE 5 MG PO TABS
5.0000 mg | ORAL_TABLET | Freq: Every evening | ORAL | Status: DC | PRN
  Administered 2020-03-31: 5 mg via ORAL
  Filled 2020-03-31: qty 1

## 2020-03-31 MED ORDER — MISOPROSTOL 25 MCG QUARTER TABLET
25.0000 ug | ORAL_TABLET | ORAL | Status: DC | PRN
  Administered 2020-03-31 (×2): 25 ug via VAGINAL
  Filled 2020-03-31 (×2): qty 1

## 2020-03-31 MED ORDER — ZOLPIDEM TARTRATE 5 MG PO TABS: 5.0000 mg | ORAL_TABLET | Freq: Every evening | ORAL | Status: DC | PRN

## 2020-03-31 MED ORDER — DIPHENHYDRAMINE HCL 50 MG/ML IJ SOLN: 12.5000 mg | INTRAMUSCULAR | Status: DC | PRN

## 2020-03-31 MED ORDER — ONDANSETRON HCL 4 MG/2ML IJ SOLN: 4.0000 mg | INTRAMUSCULAR | Status: DC | PRN

## 2020-03-31 MED ORDER — WITCH HAZEL-GLYCERIN EX PADS: 1.0000 "application " | MEDICATED_PAD | CUTANEOUS | Status: DC | PRN

## 2020-03-31 MED ORDER — ACETAMINOPHEN 325 MG PO TABS: 650.0000 mg | ORAL_TABLET | ORAL | Status: DC | PRN

## 2020-03-31 MED ORDER — LACTATED RINGERS IV SOLN: 500.0000 mL | INTRAVENOUS | Status: DC | PRN

## 2020-03-31 MED ORDER — IBUPROFEN 800 MG PO TABS: 800.0000 mg | ORAL_TABLET | Freq: Four times a day (QID) | ORAL | Status: DC

## 2020-03-31 MED ORDER — OXYTOCIN BOLUS FROM INFUSION
333.0000 mL | Freq: Once | INTRAVENOUS | Status: AC
  Administered 2020-03-31: 333 mL via INTRAVENOUS

## 2020-03-31 MED ORDER — LACTATED RINGERS IV SOLN: 500.0000 mL | Freq: Once | INTRAVENOUS | Status: DC

## 2020-03-31 MED ORDER — SENNOSIDES-DOCUSATE SODIUM 8.6-50 MG PO TABS
2.0000 | ORAL_TABLET | ORAL | Status: DC
  Administered 2020-03-31 – 2020-04-01 (×2): 2 via ORAL
  Filled 2020-03-31 (×2): qty 2

## 2020-03-31 MED ORDER — OXYCODONE-ACETAMINOPHEN 5-325 MG PO TABS
1.0000 | ORAL_TABLET | ORAL | Status: DC | PRN
  Administered 2020-03-31: 1 via ORAL
  Filled 2020-03-31: qty 1

## 2020-03-31 MED ORDER — BENZOCAINE-MENTHOL 20-0.5 % EX AERO
1.0000 "application " | INHALATION_SPRAY | CUTANEOUS | Status: DC | PRN
  Administered 2020-03-31: 1 via TOPICAL
  Filled 2020-03-31: qty 56

## 2020-03-31 MED ORDER — ZOLPIDEM TARTRATE 5 MG PO TABS: 10.0000 mg | ORAL_TABLET | Freq: Every evening | ORAL | Status: DC | PRN

## 2020-03-31 MED ORDER — TETANUS-DIPHTH-ACELL PERTUSSIS 5-2.5-18.5 LF-MCG/0.5 IM SUSY: 0.5000 mL | PREFILLED_SYRINGE | Freq: Once | INTRAMUSCULAR | Status: DC

## 2020-03-31 MED ORDER — ONDANSETRON HCL 4 MG/2ML IJ SOLN: 4.0000 mg | Freq: Four times a day (QID) | INTRAMUSCULAR | Status: DC | PRN

## 2020-03-31 MED ORDER — IBUPROFEN 600 MG PO TABS
600.0000 mg | ORAL_TABLET | Freq: Four times a day (QID) | ORAL | Status: DC
  Administered 2020-03-31 – 2020-04-02 (×9): 600 mg via ORAL
  Filled 2020-03-31 (×9): qty 1

## 2020-03-31 NOTE — Progress Notes (Signed)
Anne [name] is a 24 y.o. G3P2002 at [redacted]w[redacted]d by ultrasound admitted for induction of labor due to Post dates. Due date 03/25/20.  Subjective: Feeling CTX with more intensity.  Objective: BP 110/69   Pulse (!) 104   Temp 97.8 F (36.6 C) (Oral)   Resp 18   Ht 5\' 1"  (1.549 m)   Wt 71.5 kg   LMP 03/04/2019   BMI 29.80 kg/m  No intake/output data recorded. No intake/output data recorded.  FHT:  FHR: 140 bpm, variability: moderate,  accelerations:  Present,  decelerations:  Absent UC:   regular, every 2-3 minutes SVE:   Dilation: 3 Effacement (%): 70 Station: -2 Exam by:: [email or web address], RN  Labs: Lab Results  Component Value Date   WBC 9.1 03/31/2020   HGB 11.5 (L) 03/31/2020   HCT 35.6 (L) 03/31/2020   MCV 89.4 03/31/2020   PLT 226 03/31/2020    Assessment / Plan: Induction of labor due to postterm,  progressing well on pitocin  Labor: Progressing normally Preeclampsia:  n/a Fetal Wellbeing:  Category I Pain Control:  Labor support without medications I/D:  n/a Anticipated MOD:  NSVD  04/02/2020 03/31/2020, 12:25 PM

## 2020-03-31 NOTE — H&P (Signed)
Anne [name] is a 24 y.o. female G3P2002 at [redacted]w[redacted]d presenting for IOL.  Antepartum course complicated by first pregnancy Anne [name] sequence.  Patient has h/o asthma and has been stable this pregnancy; rescue inhaler prn.  Patient has h/o PPD which required medication.  GBS negative.  Patient received second VMP dose at 0521; feeling mild CTX.  Planning no CLEA.  OB History    Gravida  3   Para  2   Term  2   Preterm      AB      Living  2     SAB      TAB      Ectopic      Multiple  0   Live Births  2          Past Medical History:  Diagnosis Date  . Anemia   . Anxiety   . Asthma   . Depression    doing good  . History of cystitis   . Infection    UTI  . Ovarian cyst   . Ovarian cyst    "ruptured in 3rd grade"   Past Surgical History:  Procedure Laterality Date  . NO PAST SURGERIES     Family History: family history includes Anxiety disorder in her maternal grandfather, maternal grandmother, and mother; Depression in her maternal grandfather, maternal grandmother, and mother; Headache in her father; Heart disease in her maternal grandfather and paternal grandfather; Hypertension in her maternal grandfather, maternal grandmother, and paternal grandfather; Melanoma in her paternal grandmother; Stroke in her maternal grandfather and maternal grandmother; Thyroid disease in her maternal grandmother. Social History:  reports that she has quit smoking. She has never used smokeless tobacco. She reports previous alcohol use. She reports that she does not use drugs.     Maternal Diabetes: No Genetic Screening: Normal Maternal Ultrasounds/Referrals: Normal Fetal Ultrasounds or other Referrals:  None Maternal Substance Abuse:  No Significant Maternal Medications:  None Significant Maternal Lab Results:  Group B Strep negative Other Comments:  None  Review of Systems Maternal Medical History:  Contractions: Onset was 3-5 hours ago.   Frequency: rare.    Perceived severity is mild.    Fetal activity: Perceived fetal activity is normal.   Last perceived fetal movement was within the past hour.    Prenatal complications: no prenatal complications Prenatal Complications - Diabetes: none.    Dilation: 2.5 Effacement (%): 50 Station: -2 Exam by:: Dr. Langston [name] Blood pressure (!) 115/56, pulse 94, temperature (!) 97.5 F (36.4 C), temperature source Oral, resp. rate 18, height 5\' 1"  (1.549 m), weight 71.5 kg, last menstrual period 03/04/2019, unknown if currently breastfeeding. Maternal Exam:  Uterine Assessment: Contraction strength is mild.  Contraction frequency is rare.   Abdomen: Patient reports no abdominal tenderness. Fundal height is c/w dates.   Estimated fetal weight is 7#8.   Fetal presentation: vertex  Introitus: Normal vulva. Pelvis: adequate for delivery.   Cervix: Cervix evaluated by digital exam.     Fetal Exam Fetal Monitor Review: Baseline rate: 140.  Variability: moderate (6-25 bpm).   Pattern: accelerations present and no decelerations.    Fetal State Assessment: Category I - tracings are normal.     Physical Exam Constitutional:      Appearance: Normal appearance.  HENT:     Head: Normocephalic and atraumatic.  Pulmonary:     Effort: Pulmonary effort is normal.  Abdominal:     Palpations: Abdomen is soft.  Genitourinary:  General: Normal vulva.  Musculoskeletal:        General: Normal range of motion.     Cervical back: Normal range of motion.  Skin:    General: Skin is warm and dry.  Neurological:     Mental Status: She is alert and oriented to person, place, and time.  Psychiatric:        Mood and Affect: Mood normal.        Behavior: Behavior normal.     Prenatal labs: ABO, Rh: --/--/O POS (11/16 0018) Antibody: NEG (11/16 0018) Rubella: Immune (04/08 0000) RPR: Nonreactive (04/08 0000)  HBsAg: Negative (04/08 0000)  HIV: Non-reactive (04/08 0000)  GBS: Negative/-- (10/08 0000)    Assessment/Plan: 56PV [id number] at [redacted]w[redacted]d for IOL -S/P AROM -Start pitocin prn at around 930 -Planning unmedicated birth -Anticipate NSVD   Anne [name] 03/31/2020, 7:52 AM

## 2020-03-31 NOTE — Progress Notes (Signed)
Received call by day shift RN that patient had tachycardia (up to 160 bpm) on ambulation.  Patient has had this complaint throughout pregnancy and has had full cardiac evaluation including Holter and Echo; all wnl.  Hgb was 10.  EKG just returned with results unchanged from comparison study on 02/10/20. Will continue to monitor on reattempt of ambulation and consult cards in-patient prn.  Mitchel Honour, DO

## 2020-04-01 LAB — CBC
HCT: 28.6 % — ABNORMAL LOW (ref 36.0–46.0)
Hemoglobin: 9.4 g/dL — ABNORMAL LOW (ref 12.0–15.0)
MCH: 29.5 pg (ref 26.0–34.0)
MCHC: 32.9 g/dL (ref 30.0–36.0)
MCV: 89.7 fL (ref 80.0–100.0)
Platelets: 179 10*3/uL (ref 150–400)
RBC: 3.19 MIL/uL — ABNORMAL LOW (ref 3.87–5.11)
RDW: 14.6 % (ref 11.5–15.5)
WBC: 7.3 10*3/uL (ref 4.0–10.5)
nRBC: 0 % (ref 0.0–0.2)

## 2020-04-01 NOTE — Lactation Note (Signed)
This note was copied from a baby's chart. Lactation Consultation Note  Patient Name: Anne [name] [id number] Date: 04/01/2020 Reason for consult: Follow-up assessment;Term;Infant weight loss  + DAT  Baby is 21 hours old , 6 % weight loss , spit x 8 , and  Per mom mentioned the baby has been spitty and just spit up x 2, 1 wet and 3 stools .  Per mom baby has a full diaper and LC changed a large meconium diaper.  Baby awake and rooting, per mom has been using the cross cradle. LC reviewed hand expressing and noted some areola edema.  Baby latched easily with very little assistance and LC helped with positioning and depth, swallows noted.  Baby fed for 21 minutes on the right breast and mom independently latched the baby on the left breast in the cradle position with swallows and still feeding.  LC discussed nutritive vs non- nutritive feeding patterns and the importance of watching the baby for hanging out latched.  Per mom had asked the [facility] to set up the DEBP to get her milk started well and has pumped x1 with no results as of yet. LC showed mom how to use the hand pump and has already been instructed on the DEBP .  Per mom has 2 DEBP 's at home ( Medela and a Lansinoh ).  LC provided the [facility] brochure with resource numbers and [email or web address] .   Due to the + DAT - LC recommended post pumping after feedings when baby  is settled and not cluster feeding.    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  Mom mentioned with her 1st and 2nd baby her milk came in well to start.   Feeding Feeding Type: Breast Fed  LATCH Score Latch: Grasps breast easily, tongue down, lips flanged, rhythmical sucking.  Audible Swallowing: Spontaneous and  intermittent  Type of Nipple: Everted at rest and after stimulation  Comfort (Breast/Nipple): Soft / non-tender  Hold (Positioning): Assistance needed to correctly position infant at breast and maintain latch.  LATCH Score: 9  Interventions Interventions: Breast feeding basics reviewed;Assisted with latch;Skin to skin;Adjust position;Breast compression;Support pillows;Position options;Shells;Hand pump;DEBP  Lactation Tools Discussed/Used Tools: Shells;Pump;Flanges Shell Type: Inverted Breast pump type: Manual;Double-Electric Breast Pump WIC Program: Yes (per mom) Pump Review: Milk Storage   Consult Status Consult Status: Follow-up Date: 04/02/20 Follow-up type: In-patient    Anne [name] 04/01/2020, 10:53 AM

## 2020-04-01 NOTE — Lactation Note (Signed)
This note was copied from a baby's chart. Lactation Consultation Note  Patient Name: Boy Sahalie Beth [id number]'T Date: 04/01/2020 Reason for consult: Initial assessment (mom and baby asleep , LC will F/U )   Maternal Data    Feeding Feeding Type: Breast Fed  LATCH Score                   Interventions    Lactation Tools Discussed/Used     Consult Status Consult Status: Follow-up Date: 04/01/20 Follow-up type: In-patient    Matilde Sprang Evelina Lore 04/01/2020, 8:14 AM

## 2020-04-01 NOTE — Progress Notes (Signed)
Postpartum Progress Note  Post Partum Day 1 s/p spontaneous vaginal delivery. Overnight, patient had episode of tachycardia, chest heaviness, lightheadedness.  This self-resolved and has not returned.  CBC wnl and EKG consistent with prior study. Patient reports well-controlled pain, ambulating without difficulty, voiding spontaneously, tolerating PO.  Vaginal bleeding is appropriate.  She declines circumcision   Objective: Blood pressure 108/76, pulse 78, temperature 97.8 F (36.6 C), temperature source Oral, resp. rate 18, height 5\' 1"  (1.549 m), weight 71.5 kg, last menstrual period 03/04/2019, SpO2 100 %, unknown if currently breastfeeding.  Physical Exam:  General: alert and no distress Lochia: appropriate Uterine Fundus: firm DVT Evaluation: No evidence of DVT seen on physical exam.  Recent Labs    03/31/20 1911 04/01/20 0428  HGB 10.0* 9.4*  HCT 29.9* 28.6*    Assessment/Plan:  Postpartum Day 1, s/p vaginal delivery.  Continue routine postpartum care  Lactation following  Declines circ.  Anticipate discharge home tomorrow    LOS: 1 day   04/03/20 04/01/2020, 7:53 AM

## 2020-04-01 NOTE — Social Work (Signed)
CSW received consult for hx of Anxiety and Postpartum Depression.  CSW met with [name] to offer support and complete assessment.     CSW introduced self and role. CSW observed baby sleeping in bassinet and [name] in bed. CSW informed [name] of reason for consult. [name] was pleasant and confirmed history of anxiety and PPD with her first child. [name] reported her first child was a NICU baby which made things difficult. [name] stated she believes she had some mild depression after her second child which she attributes to not being able to breast feed. [name] expressed breast feeding is economically more beneficial. [name] reported she has not been on medications since April of 2020. [name] reports she was on Zoloft and found it to be helpful. [name] stated she feels comfortable following up with her OBGYN if she feels medication would be beneficial postpartum. [name] disclosed she has not previously been to therapy for her mental health concerns. [name] identifies her immediate family as supports and stated when needed, she copes with symptoms by sleeping. [name] stated she did not experience any mental health challenges during her pregnancy and reports she is currently doing okay. [name] denies any current SI, HI or being involved in any DV.   CSW provided education regarding the baby blues period vs. perinatal mood disorders, discussed treatment and gave resources for mental health follow up if concerns arise.  CSW recommends self-evaluation during the postpartum time period using the New Mom Checklist from Postpartum Progress and encouraged [name] to contact a medical professional if symptoms are noted at any time.   CSW provided review of Sudden Infant Death Syndrome (SIDS) precautions. [name] reported newborn will sleep in a crade. [name] stated she has all of the essential needs for newborn, including a carseat. [name] reports newborn will receive follow-up care at [facility] with Dr. Sherral Hammers. [name] denies any transportation barriers. [name] expressed  she would like a referral to [employer].    CSW making referral to [facility]. CSW identifies no further need for intervention and no barriers to discharge at this time.  Darra Lis, [place] [employer] [phone]

## 2020-04-02 ENCOUNTER — Encounter (HOSPITAL_COMMUNITY): Payer: Self-pay | Admitting: *Deleted

## 2020-04-02 MED ORDER — ACETAMINOPHEN 325 MG PO TABS
650.0000 mg | ORAL_TABLET | Freq: Four times a day (QID) | ORAL | 0 refills | Status: DC | PRN
Start: 2020-04-02 — End: 2022-09-12

## 2020-04-02 MED ORDER — IBUPROFEN 600 MG PO TABS
600.0000 mg | ORAL_TABLET | Freq: Four times a day (QID) | ORAL | 0 refills | Status: DC | PRN
Start: 2020-04-02 — End: 2021-04-01

## 2020-04-02 NOTE — Lactation Note (Signed)
This note was copied from a baby's chart. Lactation Consultation Note  Patient Name: Anne [name] [id number] Date: 04/02/2020 Reason for consult: Follow-up assessment;Term;Infant weight loss;Other (Comment) (9 % weight loss)  Baby is 44 hours old 9 % weight loss  Per mom Pedis recommended to supplement with feedings.  Mom has a DEBP at home and also has been pumping/ spoon feeding.  Baby awake , LC changed a wet and 2 stools.  Baby STS on the right breast / football/ latched easily with depth and baby was still feeding at 11 minutes with swallows. Increased with compressions.  Sore Nipple and engorgement prevention and tx reviewed.  Mom still has some areola edema and LC recommended shells between feedings except when sleeping, and prepump if needed prior to latch. Mom requested to be sized for NS in case she needs it  And the #20 NS fit the best.  She mentioned she used it with her 2nd baby.  LC stressed to mom if she finds her self having to use the NS consistently to latch to call back for [facility] appt.  Mom has the brochure with phone numbers.    Maternal Data Has patient been taught Hand Expression?: Yes  Feeding Feeding Type: Breast Fed  LATCH Score Latch: Grasps breast easily, tongue down, lips flanged, rhythmical sucking.  Audible Swallowing: Spontaneous and intermittent  Type of Nipple: Everted at rest and after stimulation  Comfort (Breast/Nipple): Soft / non-tender  Hold (Positioning): Assistance needed to correctly position infant at breast and maintain latch.  LATCH Score: 9  Interventions Interventions: Breast feeding basics reviewed;Assisted with latch;Skin to skin;Breast massage;Hand express;Breast compression;Adjust position;Support pillows;Position options;Expressed milk;Shells;Hand pump;DEBP  Lactation Tools Discussed/Used Tools: Shells;Pump;Flanges Flange Size: 24 Shell Type: Inverted Breast pump type: Manual;Double-Electric Breast Pump Pump Review:  Milk Storage   Consult Status Consult Status: Complete Date: 04/02/20    Kathrin Greathouse 04/02/2020, 10:39 AM

## 2020-04-02 NOTE — Progress Notes (Signed)
Post Partum Day 2 Subjective: up ad lib, voiding, tolerating PO and + flatus  Had episode of tachycardia with lightheadedness after delivery as noted in yesterday's note. Since then continues to have intermittent episodes of rapid heart beat without dizziness that are distressful.  Objective: Blood pressure 105/71, pulse 74, temperature 98.9 F (37.2 C), temperature source Oral, resp. rate 16, height 5\' 1"  (1.549 m), weight 71.5 kg, last menstrual period 03/04/2019, SpO2 100 %, unknown if currently breastfeeding.  Physical Exam:  General: alert, cooperative and no distress Lochia: appropriate Uterine Fundus: firm Incision: healing well DVT Evaluation: No evidence of DVT seen on physical exam. Cor>80s, regular Recent Labs    03/31/20 1911 04/01/20 0428  HGB 10.0* 9.4*  HCT 29.9* 28.6*    Assessment/Plan: Will consult cardiology Continue to observe   LOS: 2 days   04/03/20 II 04/02/2020, 8:08 AM

## 2020-04-02 NOTE — Lactation Note (Signed)
This note was copied from a baby's chart. Lactation Consultation Note  Patient Name: Anne [name] [id number] Date: 04/02/2020 Reason for consult: Follow-up assessment;Mother's request;Difficult latch LC entered room and mom reports he really hasn't breastfed since 3 pm.  Mom trying to latch him and infant coming off and on the breast crying and will not latch.  LC asked mom if she could help.  Placed infant STS next to his mom until he calmed.  Then showed mom to to hand express and finger feed him colostrum.  Infant started calming and after a few minutes latched and breastfed well with rhythmic sucking and intermittent swallows.  He breastfeed about 20 minutes and then fell asleep.  Mom burped him and he easily went to the right breast.Left mom and baby breastfeeding on the right breast  Urged mom to really add massage/hand expression and pumping to breastfeeding.  Since infant DAT positive.  Praised breastfeeding.  Urged to call lactation as needed.  Maternal Data Formula Feeding for Exclusion: No  Feeding Feeding Type: Breast Fed  LATCH Score Latch: Grasps breast easily, tongue down, lips flanged, rhythmical sucking.  Audible Swallowing: A few with stimulation  Type of Nipple: Everted at rest and after stimulation  Comfort (Breast/Nipple): Soft / non-tender (however mom witth visable areolar and nipple bruising)  Hold (Positioning): Assistance needed to correctly position infant at breast and maintain latch.  LATCH Score: 8  Interventions Interventions: Breast feeding basics reviewed;Assisted with latch;Breast massage;Hand express;Pre-pump if needed;Adjust position;Support pillows;Hand pump;DEBP  Lactation Tools Discussed/Used     Consult Status Consult Status: Follow-up Date: 04/02/20 Follow-up type: In-patient    [facility] Michaelle Copas 04/02/2020, 12:24 AM

## 2020-04-02 NOTE — Consult Note (Addendum)
CARDIOLOGY CONSULT NOTE  Patient ID: Anne [name] MRN: [id number] DOB/AGE: [birth year] 22 y.o.  Admit date: 03/31/2020 Referring Physician: Harold Hedge, MD Reason for Consultation:  Tachycardia  HPI:   24 y.o. 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 She has not been on any medical therapy, as her symptoms and overall improved. Patient had one episode of tachycardia up to 160s, not captured on EKG, the evening of her delivery, associated with lightheadedness. Symptoms have since improved, and mainly occur when she stands up. She denies any chest pain, shortness of breath, leg edema, orthopnea, PND.   Past Medical History:  Diagnosis Date  . Anemia   . Anxiety   . Asthma   . Depression    doing good  . History of cystitis   . Infection    UTI  . Ovarian cyst   . Ovarian cyst    "ruptured in 3rd grade"     Past Surgical History:  Procedure Laterality Date  . NO PAST SURGERIES        Family History  Problem Relation Age of Onset  . Depression Mother   . Anxiety disorder Mother   . Headache Father   . Depression Maternal Grandmother   . Anxiety disorder Maternal Grandmother   . Thyroid disease Maternal Grandmother   . Stroke Maternal Grandmother   . Hypertension Maternal Grandmother   . Depression Maternal Grandfather   . Anxiety disorder Maternal Grandfather   . Stroke Maternal Grandfather   . Hypertension Maternal Grandfather   . Heart disease Maternal Grandfather   . Melanoma Paternal Grandmother   . Hypertension Paternal Grandfather   . Heart disease Paternal Grandfather      Social History: Social History   Socioeconomic History  . Marital status: Married    Spouse name: Not on file  . Number of children: Not on file  . Years of education: Not on file  .  Highest education level: Not on file  Occupational History  . Not on file  Tobacco Use  . Smoking status: Former Games developer  . Smokeless tobacco: Never Used  . Tobacco comment: quit in Jan'21  Vaping Use  . Vaping Use: Former  Substance and Sexual Activity  . Alcohol use: Not Currently    Alcohol/week: 0.0 standard drinks  . Drug use: No  . Sexual activity: Yes    Birth control/protection: None  Other Topics Concern  . Not on file  Social History Narrative   Coley is in 22 th grade at [employer]. She is doing good this school year.   She lives with her mother. Her siblings live with their father.   Social Determinants of Health   Financial Resource Strain:   . Difficulty of Paying Living Expenses: Not on file  Food Insecurity:   . Worried About Programme researcher, broadcasting/film/video in the Last Year: Not on file  . Ran Out of Food in the Last Year: Not on file  Transportation Needs:   . Lack of Transportation (Medical): Not on file  . Lack of Transportation (Non-Medical): Not on file  Physical Activity:   . Days of Exercise per Week: Not on file  . Minutes of Exercise per Session: Not on file  Stress:   . Feeling of Stress : Not on file  Social Connections:   .  Frequency of Communication with Friends and Family: Not on file  . Frequency of Social Gatherings with Friends and Family: Not on file  . Attends Religious Services: Not on file  . Active Member of Clubs or Organizations: Not on file  . Attends Banker Meetings: Not on file  . Marital Status: Not on file  Intimate Partner Violence:   . Fear of Current or Ex-Partner: Not on file  . Emotionally Abused: Not on file  . Physically Abused: Not on file  . Sexually Abused: Not on file     Medications Prior to Admission  Medication Sig Dispense Refill Last Dose  . albuterol (PROVENTIL HFA;VENTOLIN HFA) 108 (90 Base) MCG/ACT inhaler Inhale 1-2 puffs into the lungs every 6 (six) hours as needed for wheezing or  shortness of breath.    unk  . calcium carbonate (TUMS - DOSED IN MG ELEMENTAL CALCIUM) 500 MG chewable tablet Chew 1 tablet by mouth daily.   Past Month at Unknown time  . pantoprazole (PROTONIX) 20 MG tablet Take 1 tablet (20 mg total) by mouth daily. 30 tablet 1 03/30/2020  . Prenatal Vit-Fe Fumarate-FA (PRENATAL MULTIVITAMIN) TABS tablet Take 1 tablet by mouth daily at 12 noon.    03/20/2020    Review of Systems  Constitutional: Negative for decreased appetite, malaise/fatigue, weight gain and weight loss.  HENT: Negative for congestion.   Eyes: Negative for visual disturbance.  Cardiovascular: Positive for palpitations. Negative for chest pain, dyspnea on exertion, leg swelling and syncope.  Respiratory: Negative for cough.   Endocrine: Negative for cold intolerance.  Hematologic/Lymphatic: Does not bruise/bleed easily.  Skin: Negative for itching and rash.  Musculoskeletal: Negative for myalgias.  Gastrointestinal: Negative for abdominal pain, nausea and vomiting.  Genitourinary: Negative for dysuria.  Neurological: Positive for light-headedness. Negative for dizziness and weakness.  Psychiatric/Behavioral: The patient is not nervous/anxious.   All other systems reviewed and are negative.     Physical Exam: Physical Exam Vitals and nursing note reviewed.  Constitutional:      General: She is not in acute distress.    Appearance: She is well-developed.  HENT:     Head: Normocephalic and atraumatic.  Eyes:     Conjunctiva/sclera: Conjunctivae normal.     Pupils: Pupils are equal, round, and reactive to light.  Neck:     Vascular: No JVD.  Cardiovascular:     Rate and Rhythm: Normal rate and regular rhythm.     Pulses: Normal pulses and intact distal pulses.     Heart sounds: No murmur heard.   Pulmonary:     Effort: Pulmonary effort is normal.     Breath sounds: Normal breath sounds. No wheezing or rales.  Abdominal:     General: Bowel sounds are normal.      Palpations: Abdomen is soft.     Tenderness: There is no rebound.  Musculoskeletal:        General: No tenderness. Normal range of motion.     Right lower leg: No edema.     Left lower leg: No edema.  Lymphadenopathy:     Cervical: No cervical adenopathy.  Skin:    General: Skin is warm and dry.  Neurological:     Mental Status: She is alert and oriented to person, place, and time.     Cranial Nerves: No cranial nerve deficit.      Labs:   Lab Results  Component Value Date   WBC 7.3 04/01/2020   HGB 9.4 (L)  04/01/2020   HCT 28.6 (L) 04/01/2020   MCV 89.7 04/01/2020   PLT 179 04/01/2020   No results for input(s): NA, K, CL, CO2, BUN, CREATININE, CALCIUM, PROT, BILITOT, ALKPHOS, ALT, AST, GLUCOSE in the last 168 hours.  Invalid input(s): LABALBU  Lipid Panel     Component Value Date/Time   CHOL 153 02/20/2015 1403   TRIG 110 02/20/2015 1403   HDL 54 02/20/2015 1403   CHOLHDL 2.8 02/20/2015 1403   VLDL 22 02/20/2015 1403   LDLCALC 77 02/20/2015 1403    BNP (last 3 results) No results for input(s): BNP in the last 8760 hours.  HEMOGLOBIN A1C Lab Results  Component Value Date   HGBA1C 5.5 02/20/2015   MPG 111 02/20/2015    Scheduled Meds: . ibuprofen  600 mg Oral Q6H  . prenatal multivitamin  1 tablet Oral Q1200  . senna-docusate  2 tablet Oral Q24H  . Tdap  0.5 mL Intramuscular Once   Continuous Infusions: PRN Meds:.acetaminophen, albuterol, benzocaine-Menthol, coconut oil, witch hazel-glycerin **AND** dibucaine, diphenhydrAMINE, ondansetron **OR** ondansetron (ZOFRAN) IV, oxyCODONE-acetaminophen, oxyCODONE-acetaminophen, simethicone, zolpidem  CARDIAC STUDIES:  EKG 04/02/2020: Sinus rhythm 95 bpm.  Nonspecific T wave abnormality.  EKG 03/31/2020: Sinus rhythm 95 bpm Occasional PVC Limited interpretation due to lead loss  Echocardiogram 12/04/2019: Normal LV systolic function with visual EF 50-55%. No obvious regional wall motion abnormalities. Left  ventricle cavity is normal in size. Normal wall thickness. Normal global wall motion. Normal diastolic filling pattern, normal LAP.  Mild tricuspid regurgitation. No evidence of pulmonary hypertension.  Mild pulmonic regurgitation.  IVC is normal with a respiratory response of <50%.  No prior study for comparison.  24 hour Holter monitor: Dominant rhythm normal sinus rhythm. Heart rate 66-165 bpm. Average heart rate 89bpm. No atrial fibrillation/atrial flutter/high grade AV block, sinus pause greater than or equal to 3 seconds in duration. Ventricular ectopy was 26,363 and total ventricular ectopic burden 19.7%. Total supraventricular ectopic burden 0%. Number of patient triggered events:   Assessment & Recommendations:  24 y.o. Caucasian female  with symptomatic PVC's at baseline, postpartum after vaginal delivery on 11/16 of her third baby., now with palpitations, lightheadedness  Palpitations, lightheadedness: Differentials include symptomatic PVCs, sinus tachycardia, or any other tachyarrhythmia.  Her symptoms have improved.  Resting EKG is normal.  Encourage hydration.  Discussed vagal maneuvers.  If symptoms worsen, could consider adding metoprolol in the future.  No other cardiac work-up necessary at this time.  Patient will call our office for outpatient follow-up.  Patient is okay to be discharged from cardiac standpoint.     Elder Negus, MD Pager: [phone] Office: [phone]

## 2020-04-02 NOTE — Discharge Summary (Signed)
Postpartum Discharge Summary  Date of Service updated 04/02/20     Patient Name: Anne [name] DOB: 1995-11-13 MRN: [id number]  Date of admission: 03/31/2020 Delivery date:03/31/2020  Delivering provider: Linda Hedges  Date of discharge: 04/02/2020  Admitting diagnosis: Pregnancy [Z34.90] Intrauterine pregnancy: [redacted]w[redacted]d    Secondary diagnosis:  Active Problems:   Pregnancy  Additional problems: palpitations    Discharge diagnosis: Term Pregnancy Delivered                                              Post partum procedures: Augmentation: AROM and Pitocin Complications: None  Hospital course: Induction of Labor With Vaginal Delivery   24y.o. yo G3P2002 at 440w1das admitted to the hospital 03/31/2020 for induction of labor.  Indication for induction: Postdates.  Patient had an uncomplicated labor course as follows: Membrane Rupture Time/Date: 7:38 AM ,03/31/2020   Delivery Method:Vaginal, Spontaneous  Episiotomy: None  Lacerations:  2nd degree  Details of delivery can be found in separate delivery note.  Patient had a routine postpartum course. Patient is discharged home 04/02/20.  Newborn Data: Birth date:03/31/2020  Birth time:1:42 PM  Gender:Female  Living status:Living  Apgars:8 ,9  Weight:3620 g   Magnesium Sulfate received: No BMZ received: No Rhophylac:No MMR:No T-DaP:Given prenatally Flu: No Transfusion:No  Physical exam  Vitals:   04/01/20 1421 04/01/20 1951 04/02/20 0500 04/02/20 1230  BP: 111/64 112/74 105/71 116/71  Pulse: 84 (!) 103 74 88  Resp: '16 16 16 16  ' Temp: 98 F (36.7 C) 99.3 F (37.4 C) 98.9 F (37.2 C) 98.7 F (37.1 C)  TempSrc: Oral Oral Oral Oral  SpO2: 99% 100% 100% 98%  Weight:      Height:       General: alert, cooperative and no distress Lochia: appropriate Uterine Fundus: firm Incision: Healing well with no significant drainage DVT Evaluation: No evidence of DVT seen on physical exam. Labs: Lab Results  Component  Value Date   WBC 7.3 04/01/2020   HGB 9.4 (L) 04/01/2020   HCT 28.6 (L) 04/01/2020   MCV 89.7 04/01/2020   PLT 179 04/01/2020   CMP Latest Ref Rng & Units 04/06/2018  Glucose 70 - 99 mg/dL 91  BUN 6 - 20 mg/dL <5(L)  Creatinine 0.44 - 1.00 mg/dL 0.35(L)  Sodium 135 - 145 mmol/L 134(L)  Potassium 3.5 - 5.1 mmol/L 3.2(L)  Chloride 98 - 111 mmol/L 106  CO2 22 - 32 mmol/L 21(L)  Calcium 8.9 - 10.3 mg/dL 8.3(L)  Total Protein 6.5 - 8.1 g/dL 6.5  Total Bilirubin 0.3 - 1.2 mg/dL 0.5  Alkaline Phos 38 - 126 U/L 67  AST 15 - 41 U/L 14(L)  ALT 0 - 44 U/L 10   Edinburgh Score: Edinburgh Postnatal Depression Scale Screening Tool 03/31/2020  I have been able to laugh and see the funny side of things. 0  I have looked forward with enjoyment to things. 0  I have blamed myself unnecessarily when things went wrong. 3  I have been anxious or worried for no good reason. 2  I have felt scared or panicky for no good reason. 1  Things have been getting on top of me. 2  I have been so unhappy that I have had difficulty sleeping. 0  I have felt sad or miserable. 1  I have been so unhappy that  I have been crying. 0  The thought of harming myself has occurred to me. 0  Edinburgh Postnatal Depression Scale Total 9      After visit meds:  Allergies as of 04/02/2020   No Known Allergies     Medication List    STOP taking these medications   calcium carbonate 500 MG chewable tablet Commonly known as: TUMS - dosed in mg elemental calcium   pantoprazole 20 MG tablet Commonly known as: Protonix     TAKE these medications   acetaminophen 325 MG tablet Commonly known as: Tylenol Take 2 tablets (650 mg total) by mouth every 6 (six) hours as needed (for pain scale < 4).   albuterol 108 (90 Base) MCG/ACT inhaler Commonly known as: VENTOLIN HFA Inhale 1-2 puffs into the lungs every 6 (six) hours as needed for wheezing or shortness of breath.   ibuprofen 600 MG tablet Commonly known as:  ADVIL Take 1 tablet (600 mg total) by mouth every 6 (six) hours as needed.   prenatal multivitamin Tabs tablet Take 1 tablet by mouth daily at 12 noon.        Discharge home in stable condition Infant Feeding: Breast Infant Disposition:home with mother Discharge instruction: per After Visit Summary and Postpartum booklet. Activity: Advance as tolerated. Pelvic rest for 6 weeks.  Diet: routine diet Anticipated Birth Control: Unsure Postpartum Appointment:6 weeks Additional Postpartum F/U:  Future Appointments: Future Appointments  Date Time Provider [place]  07/20/2020 10:15 AM Rex Kras, DO [facility] None   Follow up Visit:      04/02/2020 Allena Katz, MD

## 2020-06-08 ENCOUNTER — Other Ambulatory Visit: Payer: Self-pay

## 2020-06-08 ENCOUNTER — Emergency Department (HOSPITAL_BASED_OUTPATIENT_CLINIC_OR_DEPARTMENT_OTHER)
Admission: EM | Admit: 2020-06-08 | Discharge: 2020-06-08 | Disposition: A | Discharge status: Home / Self Care | Payer: Medicaid Other | Attending: Emergency Medicine | Admitting: Emergency Medicine

## 2020-06-08 ENCOUNTER — Encounter (HOSPITAL_BASED_OUTPATIENT_CLINIC_OR_DEPARTMENT_OTHER): Payer: Self-pay | Admitting: Emergency Medicine

## 2020-06-08 DIAGNOSIS — R42 Dizziness and giddiness: Secondary | ICD-10-CM | POA: Diagnosis not present

## 2020-06-08 DIAGNOSIS — R112 Nausea with vomiting, unspecified: Secondary | ICD-10-CM | POA: Insufficient documentation

## 2020-06-08 DIAGNOSIS — R002 Palpitations: Secondary | ICD-10-CM

## 2020-06-08 DIAGNOSIS — Z87891 Personal history of nicotine dependence: Secondary | ICD-10-CM | POA: Diagnosis not present

## 2020-06-08 DIAGNOSIS — J45909 Unspecified asthma, uncomplicated: Secondary | ICD-10-CM | POA: Diagnosis not present

## 2020-06-08 LAB — URINALYSIS, ROUTINE W REFLEX MICROSCOPIC
Bilirubin Urine: NEGATIVE
Glucose, UA: NEGATIVE mg/dL
Hgb urine dipstick: NEGATIVE
Ketones, ur: NEGATIVE mg/dL
Leukocytes,Ua: NEGATIVE
Nitrite: NEGATIVE
Protein, ur: NEGATIVE mg/dL
Specific Gravity, Urine: 1.015 (ref 1.005–1.030)
pH: 6.5 (ref 5.0–8.0)

## 2020-06-08 LAB — PREGNANCY, URINE: Preg Test, Ur: NEGATIVE

## 2020-06-08 MED ORDER — ONDANSETRON 4 MG PO TBDP
4.0000 mg | ORAL_TABLET | Freq: Once | ORAL | Status: AC
  Administered 2020-06-08: 4 mg via ORAL
  Filled 2020-06-08: qty 1

## 2020-06-08 NOTE — ED Provider Notes (Signed)
[facility] EMERGENCY DEPARTMENT Provider Note   CSN: [id number] Arrival date & time: 06/08/20  9150     History Chief Complaint  Patient presents with  . Emesis    Anne [name] is a 25 y.o. female.  HPI     This is a 25 year old female with a history of asthma and anxiety who presents with multiple complaints.  Primarily she states that she had a wave of nausea yesterday evening associated with heart palpitations and "hot flash."  She states symptoms started at 7 PM.  She had one episode that lasted about 15 minutes.  She had a second episode this morning.  She states she has felt slightly flushed and dizzy.  Denies any recent illnesses.  She did test positive for COVID-19 at the end of November after giving birth to her son.  She is approximately 2 months postpartum.  Has not noted any abdominal pain, shortness of breath, chest pain.  Regarding her heart palpitations, no known history of blood clots and no known cardiac disorders.  She is currently asymptomatic.  No recent changes in medications.  Denies alcohol or drug use.  Denies excessive caffeine use.  Past Medical History:  Diagnosis Date  . Anemia   . Anxiety   . Asthma   . Depression    doing good  . History of cystitis   . Infection    UTI  . Ovarian cyst   . Ovarian cyst    "ruptured in 3rd grade"    Patient Active Problem List   Diagnosis Date Noted  . Pregnancy 06/26/2018  . Pregnant 07/02/2017  . 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 Hirsutism 02/20/2015  . Irregular periods 02/20/2015  . 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 Mood disorder (HCC) 08/26/2014    Past Surgical History:  Procedure Laterality Date  . NO PAST SURGERIES       OB History    Gravida  3   Para  2   Term  2   Preterm      AB      Living  2     SAB      IAB      Ectopic      Multiple  0   Live Births  2           Family History  Problem Relation Age  of Onset  . Depression Mother   . Anxiety disorder Mother   . Headache Father   . Depression Maternal Grandmother   . Anxiety disorder Maternal Grandmother   . Thyroid disease Maternal Grandmother   . Stroke Maternal Grandmother   . Hypertension Maternal Grandmother   . Depression Maternal Grandfather   . Anxiety disorder Maternal Grandfather   . Stroke Maternal Grandfather   . Hypertension Maternal Grandfather   . Heart disease Maternal Grandfather   . Melanoma Paternal Grandmother   . Hypertension Paternal Grandfather   . Heart disease Paternal Grandfather     Social History   Tobacco Use  . Smoking status: Former Games developer  . Smokeless tobacco: Never Used  . Tobacco comment: quit in Jan'21  Vaping Use  . Vaping Use: Former  Substance Use Topics  . Alcohol use: Not Currently    Alcohol/week: 0.0 standard drinks  . Drug use: No    Home Medications Prior to Admission medications   Medication Sig Start Date End Date Taking? Authorizing Provider  acetaminophen (TYLENOL) 325 MG tablet Take 2  tablets (650 mg total) by mouth every 6 (six) hours as needed (for pain scale < 4). 04/02/20   Harold Hedge, MD  albuterol (PROVENTIL HFA;VENTOLIN HFA) 108 (90 Base) MCG/ACT inhaler Inhale 1-2 puffs into the lungs every 6 (six) hours as needed for wheezing or shortness of breath.     [provider]  ibuprofen (ADVIL) 600 MG tablet Take 1 tablet (600 mg total) by mouth every 6 (six) hours as needed. 04/02/20   Harold Hedge, MD  Prenatal Vit-Fe Fumarate-FA (PRENATAL MULTIVITAMIN) TABS tablet Take 1 tablet by mouth daily at 12 noon.     [provider]    Allergies    Patient has no known allergies.  Review of Systems   Review of Systems  Constitutional: Negative for fever.  Respiratory: Negative for shortness of breath.   Cardiovascular: Positive for palpitations. Negative for chest pain.  Gastrointestinal: Positive for nausea.  Genitourinary: Negative for  dysuria.  Neurological: Positive for headaches.  All other systems reviewed and are negative.   Physical Exam Updated Vital Signs BP 113/70 (BP Location: Right Arm)   Pulse 85   Temp 98.1 F (36.7 C) (Oral)   Resp 18   Ht 1.549 m (5\' 1" )   Wt 71.5 kg   LMP 05/17/2019   SpO2 100%   BMI 29.78 kg/m   Physical Exam Vitals and nursing note reviewed.  Constitutional:      Appearance: She is well-developed and well-nourished.  HENT:     Head: Normocephalic and atraumatic.     Nose: Nose normal.     Mouth/Throat:     Mouth: Mucous membranes are moist.  Eyes:     Pupils: Pupils are equal, round, and reactive to light.  Cardiovascular:     Rate and Rhythm: Normal rate and regular rhythm.     Heart sounds: Normal heart sounds.  Pulmonary:     Effort: Pulmonary effort is normal. No respiratory distress.     Breath sounds: No wheezing.  Abdominal:     General: Bowel sounds are normal.     Palpations: Abdomen is soft.  Musculoskeletal:        General: No tenderness.     Cervical back: Neck supple.     Right lower leg: No edema.     Left lower leg: No edema.  Skin:    General: Skin is warm and dry.  Neurological:     Mental Status: She is alert and oriented to person, place, and time.  Psychiatric:        Mood and Affect: Mood and affect and mood normal.     ED Results / Procedures / Treatments   Labs (all labs ordered are listed, but only abnormal results are displayed) Labs Reviewed  URINALYSIS, ROUTINE W REFLEX MICROSCOPIC  PREGNANCY, URINE    EKG None  Radiology No results found.  Procedures Procedures (including critical care time)  Medications Ordered in ED Medications  ondansetron (ZOFRAN-ODT) disintegrating tablet 4 mg (4 mg Oral Given 06/08/20 0419)    ED Course  I have reviewed the triage vital signs and the nursing notes.  Pertinent labs & imaging results that were available during my care of the patient were reviewed by me and considered in my  medical decision making (see chart for details).    MDM Rules/Calculators/A&P                          Patient presents with palpitations and  episode of dizziness and hot flash last night.  Symptoms have resolved.  EKG without acute arrhythmia or ischemia.  Pregnancy test is negative.  Urinalysis shows no evidence of dehydration and no evidence of UTI.  She is not orthostatic.  She is remained on the monitor and without repeat symptoms here in the emergency department.  Do not feel she needs further work-up at this time.  Have low suspicion for arrhythmia.  Given stable vital signs and no respiratory distress, low suspicion for PE and she is PERC negative.   After history, exam, and medical workup I feel the patient has been appropriately medically screened and is safe for discharge home. Pertinent diagnoses were discussed with the patient. Patient was given return precautions.   Final Clinical Impression(s) / ED Diagnoses Final diagnoses:  Palpitations    Rx / DC Orders ED Discharge Orders    None       Karo Rog, Mayer Masker, MD 06/08/20 [phone]

## 2020-06-08 NOTE — ED Triage Notes (Signed)
Patient presents with complaints of heart palpitations, nausea, vomiting, waves of hot flashes and headache onset Sunday evening.

## 2020-06-08 NOTE — Discharge Instructions (Signed)
You are seen today for palpitations and heart fluttering.  Your EKG is reassuring.  Make sure that you are staying well-hydrated.  Avoid excessive caffeine.

## 2020-07-20 ENCOUNTER — Ambulatory Visit: Payer: Medicaid Other | Admitting: Cardiology

## 2020-08-05 DIAGNOSIS — M5417 Radiculopathy, lumbosacral region: Secondary | ICD-10-CM | POA: Diagnosis not present

## 2020-08-05 DIAGNOSIS — M9905 Segmental and somatic dysfunction of pelvic region: Secondary | ICD-10-CM | POA: Diagnosis not present

## 2020-08-05 DIAGNOSIS — M9903 Segmental and somatic dysfunction of lumbar region: Secondary | ICD-10-CM | POA: Diagnosis not present

## 2020-08-05 DIAGNOSIS — M545 Low back pain, unspecified: Secondary | ICD-10-CM | POA: Diagnosis not present

## 2020-08-20 DIAGNOSIS — L040 Acute lymphadenitis of face, head and neck: Secondary | ICD-10-CM | POA: Diagnosis not present

## 2020-10-25 DIAGNOSIS — B349 Viral infection, unspecified: Secondary | ICD-10-CM | POA: Diagnosis not present

## 2020-10-25 DIAGNOSIS — Z20828 Contact with and (suspected) exposure to other viral communicable diseases: Secondary | ICD-10-CM | POA: Diagnosis not present

## 2020-10-25 DIAGNOSIS — J3489 Other specified disorders of nose and nasal sinuses: Secondary | ICD-10-CM | POA: Diagnosis not present

## 2020-11-10 DIAGNOSIS — O219 Vomiting of pregnancy, unspecified: Secondary | ICD-10-CM | POA: Diagnosis not present

## 2020-11-10 DIAGNOSIS — N911 Secondary amenorrhea: Secondary | ICD-10-CM | POA: Diagnosis not present

## 2020-11-17 DIAGNOSIS — Z3A08 8 weeks gestation of pregnancy: Secondary | ICD-10-CM | POA: Diagnosis not present

## 2020-11-17 DIAGNOSIS — Z3481 Encounter for supervision of other normal pregnancy, first trimester: Secondary | ICD-10-CM | POA: Diagnosis not present

## 2020-11-17 LAB — OB RESULTS CONSOLE ANTIBODY SCREEN: Antibody Screen: NEGATIVE

## 2020-11-17 LAB — HEPATITIS C ANTIBODY: HCV Ab: NEGATIVE

## 2020-11-17 LAB — OB RESULTS CONSOLE ABO/RH: RH Type: POSITIVE

## 2020-11-17 LAB — OB RESULTS CONSOLE RUBELLA ANTIBODY, IGM: Rubella: IMMUNE

## 2020-11-17 LAB — OB RESULTS CONSOLE RPR: RPR: NONREACTIVE

## 2020-11-17 LAB — OB RESULTS CONSOLE HIV ANTIBODY (ROUTINE TESTING): HIV: NONREACTIVE

## 2020-11-17 LAB — OB RESULTS CONSOLE GC/CHLAMYDIA
Chlamydia: NEGATIVE
Gonorrhea: NEGATIVE

## 2020-11-17 LAB — OB RESULTS CONSOLE HEPATITIS B SURFACE ANTIGEN: Hepatitis B Surface Ag: NEGATIVE

## 2020-12-10 ENCOUNTER — Other Ambulatory Visit: Payer: Self-pay | Admitting: Obstetrics & Gynecology

## 2020-12-10 ENCOUNTER — Other Ambulatory Visit (HOSPITAL_COMMUNITY): Payer: Self-pay | Admitting: Obstetrics & Gynecology

## 2020-12-10 DIAGNOSIS — R1011 Right upper quadrant pain: Secondary | ICD-10-CM

## 2020-12-15 ENCOUNTER — Other Ambulatory Visit: Payer: Self-pay

## 2020-12-15 ENCOUNTER — Ambulatory Visit (HOSPITAL_COMMUNITY)
Admission: RE | Admit: 2020-12-15 | Discharge: 2020-12-15 | Disposition: A | Discharge status: Home / Self Care | Payer: Medicaid Other | Source: Ambulatory Visit | Attending: Obstetrics & Gynecology | Admitting: Obstetrics & Gynecology

## 2020-12-15 DIAGNOSIS — R1011 Right upper quadrant pain: Secondary | ICD-10-CM | POA: Insufficient documentation

## 2020-12-16 DIAGNOSIS — Z3A12 12 weeks gestation of pregnancy: Secondary | ICD-10-CM | POA: Diagnosis not present

## 2020-12-16 DIAGNOSIS — Z3682 Encounter for antenatal screening for nuchal translucency: Secondary | ICD-10-CM | POA: Diagnosis not present

## 2021-02-17 DIAGNOSIS — Z3482 Encounter for supervision of other normal pregnancy, second trimester: Secondary | ICD-10-CM | POA: Diagnosis not present

## 2021-02-17 DIAGNOSIS — Z363 Encounter for antenatal screening for malformations: Secondary | ICD-10-CM | POA: Diagnosis not present

## 2021-02-17 DIAGNOSIS — Z3A21 21 weeks gestation of pregnancy: Secondary | ICD-10-CM | POA: Diagnosis not present

## 2021-02-23 ENCOUNTER — Other Ambulatory Visit: Payer: Self-pay | Admitting: Obstetrics & Gynecology

## 2021-02-23 DIAGNOSIS — Z363 Encounter for antenatal screening for malformations: Secondary | ICD-10-CM

## 2021-03-10 ENCOUNTER — Other Ambulatory Visit: Payer: Self-pay

## 2021-03-10 ENCOUNTER — Ambulatory Visit: Payer: Medicaid Other

## 2021-03-15 ENCOUNTER — Encounter: Payer: Self-pay | Admitting: *Deleted

## 2021-03-17 ENCOUNTER — Ambulatory Visit: Payer: Medicaid Other

## 2021-03-22 ENCOUNTER — Other Ambulatory Visit: Payer: Self-pay

## 2021-04-01 ENCOUNTER — Encounter: Payer: Self-pay | Admitting: *Deleted

## 2021-04-01 ENCOUNTER — Ambulatory Visit: Payer: Medicaid Other | Admitting: *Deleted

## 2021-04-01 ENCOUNTER — Ambulatory Visit: Payer: Medicaid Other | Attending: Obstetrics & Gynecology

## 2021-04-01 ENCOUNTER — Other Ambulatory Visit: Payer: Self-pay

## 2021-04-01 VITALS — BP 111/65 | HR 92

## 2021-04-01 DIAGNOSIS — Z362 Encounter for other antenatal screening follow-up: Secondary | ICD-10-CM | POA: Diagnosis present

## 2021-04-01 DIAGNOSIS — Z363 Encounter for antenatal screening for malformations: Secondary | ICD-10-CM | POA: Insufficient documentation

## 2021-04-02 ENCOUNTER — Encounter: Payer: Self-pay | Admitting: Neurology

## 2021-04-02 ENCOUNTER — Ambulatory Visit (INDEPENDENT_AMBULATORY_CARE_PROVIDER_SITE_OTHER): Payer: BC Managed Care – PPO | Admitting: Neurology

## 2021-04-02 VITALS — BP 97/59 | HR 81 | Ht 61.0 in | Wt 148.0 lb

## 2021-04-02 DIAGNOSIS — G4721 Circadian rhythm sleep disorder, delayed sleep phase type: Secondary | ICD-10-CM | POA: Diagnosis not present

## 2021-04-02 DIAGNOSIS — G4719 Other hypersomnia: Secondary | ICD-10-CM | POA: Diagnosis not present

## 2021-04-02 DIAGNOSIS — Z872 Personal history of diseases of the skin and subcutaneous tissue: Secondary | ICD-10-CM

## 2021-04-02 DIAGNOSIS — G4730 Sleep apnea, unspecified: Secondary | ICD-10-CM

## 2021-04-02 DIAGNOSIS — G4710 Hypersomnia, unspecified: Secondary | ICD-10-CM

## 2021-04-02 DIAGNOSIS — R6889 Other general symptoms and signs: Secondary | ICD-10-CM

## 2021-04-02 DIAGNOSIS — O35AXX0 Maternal care for other (suspected) fetal abnormality and damage, fetal facial anomalies, not applicable or unspecified: Secondary | ICD-10-CM

## 2021-04-02 DIAGNOSIS — G478 Other sleep disorders: Secondary | ICD-10-CM | POA: Diagnosis not present

## 2021-04-02 NOTE — Addendum Note (Signed)
Addended by: Melvyn Novas on: 04/02/2021 09:57 AM   Modules accepted: Orders
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
## 2021-04-02 NOTE — Patient Instructions (Signed)
Hypersomnia Hypersomnia is a condition in which a person feels very tired during the day even though he or she gets plenty of sleep at night. A person with this condition may take naps during the day and may find it very difficult to wake up from sleep. Hypersomnia may affect a person's ability to think, concentrate, drive, or remember things. What are the causes? The cause of this condition may not be known. Possible causes include: Certain medicines. Sleep disorders, such as narcolepsy and sleep apnea. Injury to the head, brain, or spinal cord. Drug or alcohol use. Gastroesophageal reflux disease (GERD). Tumors. Certain medical conditions, such as depression, diabetes, or an underactive thyroid gland (hypothyroidism). What are the signs or symptoms? The main symptoms of hypersomnia include: Feeling very tired throughout the day, regardless of how much sleep you got the night before. Having trouble waking up. Others may find it difficult to wake you up when you are sleeping. Sleeping for longer and longer periods at a time. Taking naps throughout the day. Other symptoms may include: Feeling restless, anxious, or annoyed. Lacking energy. Having trouble with: Remembering. Speaking. Thinking. Loss of appetite. Seeing, hearing, tasting, smelling, or feeling things that are not real (hallucinations). How is this diagnosed? This condition may be diagnosed based on: Your symptoms and medical history. Your sleeping habits. Your health care provider may ask you to write down your sleeping habits in a daily sleep log, along with any symptoms you have. A series of tests that are done while you sleep (sleep study or polysomnogram). A test that measures how quickly you can fall asleep during the day (daytime nap study or multiple sleep latency test). How is this treated? Treatment can help you manage your condition. Treatment may include: Following a regular sleep routine. Lifestyle changes,  such as changing your eating habits, getting regular exercise, and avoiding alcohol or caffeinated beverages. Taking medicines to make you more alert (stimulants) during the day. Treating any underlying medical causes of hypersomnia. Follow these instructions at home: Sleep routine  Schedule the same bedtime and wake-up time each day. Practice a relaxing bedtime routine. This may include reading, meditation, deep breathing, or taking a warm bath before going to sleep. Get regular exercise each day. Avoid strenuous exercise in the evening hours. Keep your sleep environment at a cooler temperature, darkened, and quiet. Sleep with pillows and a mattress that are comfortable and supportive. Schedule short 20-minute naps for when you feel sleepiest during the day. Talk with your employer or teachers about your hypersomnia. If possible, adjust your schedule so that: You have a regular daytime work schedule. You can take a scheduled nap during the day. You do not have to work or be active at night. Do not eat a heavy meal for a few hours before bedtime. Eat your meals at about the same times every day. Avoid drinking alcohol or caffeinated beverages. Safety  Do not drive or use heavy machinery if you are sleepy. Ask your health care provider if it is safe for you to drive. Wear a life jacket when swimming or spending time near water. General instructions Take supplements and over-the-counter and prescription medicines only as told by your health care provider. Keep a sleep log that will help your doctor manage your condition. This may include information about: What time you go to bed each night. How often you wake up at night. How many hours you sleep at night. How often and for how long you nap during the day.   Any observations from others, such as leg movements during sleep, sleep walking, or snoring. Keep all follow-up visits as told by your health care provider. This is important. Contact  a health care provider if: You have new symptoms. Your symptoms get worse. Get help right away if: You have serious thoughts about hurting yourself or someone else. If you ever feel like you may hurt yourself or others, or have thoughts about taking your own life, get help right away. You can go to your nearest emergency department or call: Your local emergency services (911 in the [place] A suicide crisis helpline, such as the National Suicide Prevention Lifeline at 908-376-9875 or 988 in the [place] This is open 24 hours a day. Summary Hypersomnia refers to a condition in which you feel very tired during the day even though you get plenty of sleep at night. A person with this condition may take naps during the day and may find it very difficult to wake up from sleep. Hypersomnia may affect a person's ability to think, concentrate, drive, or remember things. Treatment, such as following a regular sleep routine and making some lifestyle changes, can help you manage your condition. This information is not intended to replace advice given to you by your health care provider. Make sure you discuss any questions you have with your health care provider. Document Revised: 11/25/2020 Document Reviewed: 03/12/2020 Elsevier Patient Education  2022 Elsevier Inc. Screening for Sleep Apnea Sleep apnea is a condition in which breathing pauses or becomes shallow during sleep. Sleep apnea screening is a test to determine if you are at risk for sleep apnea. The test includes a series of questions. It will only takes a few minutes. Your health care provider may ask you to have this test in preparation for surgery or as part of a physical exam. What are the symptoms of sleep apnea? Common symptoms of sleep apnea include: Snoring. Waking up often at night. Daytime sleepiness. Pauses in breathing. Choking or gasping during sleep. Irritability. Forgetfulness. Trouble thinking clearly. Depression. Personality  changes. Most people with sleep apnea do not know that they have it. What are the advantages of sleep apnea screening? Getting screened for sleep apnea can help: Ensure your safety. It is important for your health care providers to know whether or not you have sleep apnea, especially if you are having surgery or have other long-term (chronic) health conditions. Improve your health and allow you to get a better night's rest. Restful sleep can help you: Have more energy. Lose weight. Improve high blood pressure. Improve diabetes management. Prevent stroke. Prevent car accidents. What happens during the screening? Screening usually includes being asked a list of questions about your sleep quality. Some questions you may be asked include: Do you snore? Is your sleep restless? Do you have daytime sleepiness? Has a partner or spouse told you that you stop breathing during sleep? Have you had trouble concentrating or memory loss? What is your age? What is your neck circumference? To measure your neck, keep your back straight and gently wrap the tape measure around your neck. Put the tape measure at the middle of your neck, between your chin and collarbone. What is your sex assigned at birth? Do you have or are you being treated for high blood pressure? If your screening test is positive, you are at risk for the condition. Further testing may be needed to confirm a diagnosis of sleep apnea. Where to find more information You can find screening tools online  or at your health care clinic. For more information about sleep apnea screening and healthy sleep, visit these websites: Centers for Disease Control and Prevention: [employer]Ex[employer].br American Sleep Apnea Association: www.sleepapnea.org Contact a health care provider if: You think that you may have sleep apnea. Summary Sleep apnea screening can help determine if you are at risk for sleep apnea. It is important for your health care providers to  know whether or not you have sleep apnea, especially if you are having surgery or have other chronic health conditions. You may be asked to take a screening test for sleep apnea in preparation for surgery or as part of a physical exam. This information is not intended to replace advice given to you by your health care provider. Make sure you discuss any questions you have with your health care provider. Document Revised: 04/10/2020 Document Reviewed: 04/10/2020 Elsevier Patient Education  2022 [employer].

## 2021-04-07 ENCOUNTER — Institutional Professional Consult (permissible substitution): Payer: BC Managed Care – PPO | Admitting: Neurology

## 2021-04-07 ENCOUNTER — Telehealth: Payer: Self-pay | Admitting: Neurology

## 2021-04-07 NOTE — Telephone Encounter (Signed)
Did we send for narcolepsy HLA ?

## 2021-04-12 LAB — NARCOLEPSY EVALUATION
DQA1*01:02: POSITIVE
DQB1*06:02: POSITIVE

## 2021-04-13 NOTE — Progress Notes (Signed)
Strong positive HLA narcolepsy result.

## 2021-04-14 ENCOUNTER — Telehealth: Payer: Self-pay | Admitting: Neurology

## 2021-04-14 NOTE — Telephone Encounter (Signed)
-----  Message from Larey Seat, MD sent at 04/13/2021  4:31 PM EST ----- Strong positive HLA narcolepsy result.

## 2021-04-14 NOTE — Telephone Encounter (Signed)
Called the patient to review lab results.  Advised that both strands showed positive for the narcolepsy gene.  Advised the only way to diagnostically test this would be to complete the overnight sleep study in the daytime sleep study.  Patient is in her third trimester of pregnancy and there is concerned that the test may not be quite as valid.  Patient would like to pursue the narcolepsy work-up but will wait until after she has delivered her baby.  She will reach out to [place] when she is ready to schedule and we can review getting her set up at that time.  As of this moment her medication list does not show anything that would be concerning for completing the test.  When the patient calls with which is needed verify that she has not started any new medications.

## 2021-05-04 DIAGNOSIS — R059 Cough, unspecified: Secondary | ICD-10-CM | POA: Diagnosis not present

## 2021-05-04 DIAGNOSIS — R519 Headache, unspecified: Secondary | ICD-10-CM | POA: Diagnosis not present

## 2021-05-04 DIAGNOSIS — R0981 Nasal congestion: Secondary | ICD-10-CM | POA: Diagnosis not present

## 2021-05-04 DIAGNOSIS — Z20828 Contact with and (suspected) exposure to other viral communicable diseases: Secondary | ICD-10-CM | POA: Diagnosis not present

## 2021-05-13 DIAGNOSIS — D649 Anemia, unspecified: Secondary | ICD-10-CM | POA: Diagnosis not present

## 2021-05-13 DIAGNOSIS — Z3A33 33 weeks gestation of pregnancy: Secondary | ICD-10-CM | POA: Diagnosis not present

## 2021-05-13 DIAGNOSIS — Z3483 Encounter for supervision of other normal pregnancy, third trimester: Secondary | ICD-10-CM | POA: Diagnosis not present

## 2021-05-16 NOTE — L&D Delivery Note (Signed)
Delivery Note At 12:51 AM a viable female was delivered via Vaginal, Spontaneous (Presentation: LOA).  APGAR: 9, 9; weight pending.   Placenta status: Spontaneous, Intact.  Cord: 3 vessels with the following complications: None.  Cord pH: n/a  Anesthesia: None Episiotomy: None Lacerations: 1st degree Suture Repair: 3.0 vicryl rapide Est. Blood Loss (mL): 300  Mom to postpartum.  Baby to Couplet care / Skin to Skin.  Mitchel Honour 06/30/2021, 1:16 AM

## 2021-05-29 DIAGNOSIS — S99912A Unspecified injury of left ankle, initial encounter: Secondary | ICD-10-CM | POA: Diagnosis not present

## 2021-05-30 ENCOUNTER — Emergency Department (HOSPITAL_BASED_OUTPATIENT_CLINIC_OR_DEPARTMENT_OTHER)
Admission: EM | Admit: 2021-05-30 | Discharge: 2021-05-30 | Disposition: A | Discharge status: Home / Self Care | Payer: Medicaid Other | Attending: Emergency Medicine | Admitting: Emergency Medicine

## 2021-05-30 ENCOUNTER — Encounter (HOSPITAL_BASED_OUTPATIENT_CLINIC_OR_DEPARTMENT_OTHER): Payer: Self-pay

## 2021-05-30 ENCOUNTER — Other Ambulatory Visit: Payer: Self-pay

## 2021-05-30 ENCOUNTER — Emergency Department (HOSPITAL_BASED_OUTPATIENT_CLINIC_OR_DEPARTMENT_OTHER): Payer: Medicaid Other

## 2021-05-30 DIAGNOSIS — Z79899 Other long term (current) drug therapy: Secondary | ICD-10-CM | POA: Diagnosis not present

## 2021-05-30 DIAGNOSIS — M25572 Pain in left ankle and joints of left foot: Secondary | ICD-10-CM | POA: Diagnosis not present
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
## 2021-05-30 NOTE — Discharge Instructions (Signed)
Your x-ray did not show broken bone.  Most likely this is a sprain.  Tylenol around-the-clock.  I have given you crutches and a splint.  Try to keep your weight off of it for the next week.  Follow-up with your family doctor.

## 2021-05-30 NOTE — ED Provider Notes (Signed)
[facility] EMERGENCY DEPARTMENT Provider Note   CSN: [id number] Arrival date & time: 05/30/21  0013     History  Chief Complaint  Patient presents with   Ankle Pain    Anne [name] is a 26 y.o. female.  26 yo F with a chief complaints of left ankle pain.  The patient was getting out of her car and misjudged the distance to the ground and inverted her ankle.  Complaining of pain and swelling to the ankle.  Has had pain with ambulation.  Went to urgent care and was told that she needed an x-ray.  Denies pain at the knee.  The history is provided by the patient.  Ankle Pain Associated symptoms: no fever   Illness Severity:  Moderate Onset quality:  Gradual Duration:  1 day Timing:  Constant Progression:  Worsening Chronicity:  New Associated symptoms: no chest pain, no congestion, no fever, no headaches, no myalgias, no nausea, no rhinorrhea, no shortness of breath, no vomiting and no wheezing       Home Medications Prior to Admission medications   Medication Sig Start Date End Date Taking? Authorizing Provider  acetaminophen (TYLENOL) 325 MG tablet Take 2 tablets (650 mg total) by mouth every 6 (six) hours as needed (for pain scale < 4). 04/02/20   Everlene Farrier, MD  albuterol (PROVENTIL HFA;VENTOLIN HFA) 108 (90 Base) MCG/ACT inhaler Inhale 1-2 puffs into the lungs every 6 (six) hours as needed for wheezing or shortness of breath.     [provider]  Prenatal Vit-Fe Fumarate-FA (PRENATAL MULTIVITAMIN) TABS tablet Take 1 tablet by mouth daily at 12 noon.     [provider]      Allergies    Patient has no known allergies.    Review of Systems   Review of Systems  Constitutional:  Negative for chills and fever.  HENT:  Negative for congestion and rhinorrhea.   Eyes:  Negative for redness and visual disturbance.  Respiratory:  Negative for shortness of breath and wheezing.   Cardiovascular:  Negative for chest pain and palpitations.   Gastrointestinal:  Negative for nausea and vomiting.  Genitourinary:  Negative for dysuria and urgency.  Musculoskeletal:  Positive for arthralgias. Negative for myalgias.  Skin:  Negative for pallor and wound.  Neurological:  Negative for dizziness and headaches.   Physical Exam Updated Vital Signs BP 123/85 (BP Location: Right Arm)    Pulse 86    Temp 98.1 F (36.7 C) (Oral)    Resp 18    Ht 5\' 1"  (1.549 m)    Wt 68 kg    LMP 10/26/2020    SpO2 99%    BMI 28.34 kg/m  Physical Exam Vitals and nursing note reviewed.  Constitutional:      General: She is not in acute distress.    Appearance: She is well-developed. She is not diaphoretic.  HENT:     Head: Normocephalic and atraumatic.  Eyes:     Pupils: Pupils are equal, round, and reactive to light.  Cardiovascular:     Rate and Rhythm: Normal rate and regular rhythm.     Heart sounds: No murmur heard.   No friction rub. No gallop.  Pulmonary:     Effort: Pulmonary effort is normal.     Breath sounds: No wheezing or rales.  Abdominal:     General: There is no distension.     Palpations: Abdomen is soft.     Tenderness: There is no abdominal  tenderness.     Comments: Gravid  Musculoskeletal:        General: No tenderness.     Cervical back: Normal range of motion and neck supple.     Comments: No obvious pain on either malleolus.  No pain at the fibular neck.  Pulse motor and sensation intact distally.  Skin:    General: Skin is warm and dry.  Neurological:     Mental Status: She is alert and oriented to person, place, and time.  Psychiatric:        Behavior: Behavior normal.    ED Results / Procedures / Treatments   Labs (all labs ordered are listed, but only abnormal results are displayed) Labs Reviewed - No data to display  EKG None  Radiology DG Ankle Complete Left  Result Date: 05/30/2021 CLINICAL DATA:  ankle pain post fall EXAM: LEFT ANKLE COMPLETE - 3+ VIEW COMPARISON:  X-ray left ankle 03/25/2019.  FINDINGS: There is no evidence of fracture, dislocation, or joint effusion. Similar appear densely sclerotic lesion within the distal fibula likely bone island. There is no evidence of arthropathy or other focal bone abnormality. Soft tissues are unremarkable. IMPRESSION: No acute displaced fracture or dislocation. Electronically Signed   By: Iven Finn M.D.   On: 05/30/2021 00:54    Procedures Procedures    Medications Ordered in ED Medications - No data to display  ED Course/ Medical Decision Making/ A&P                           Medical Decision Making  26 yo F with a chief complaints of left ankle pain after an inversion injury.  Unlikely to be fractured based on [place] ankle rules.  Will obtain a plain film.  Plain film viewed by me without fracture.  ASO crutches PCP follow-up.  1:10 AM:  I have discussed the diagnosis/risks/treatment options with the patient and believe the pt to be eligible for discharge home to follow-up with PCP. We also discussed returning to the ED immediately if new or worsening sx occur. We discussed the sx which are most concerning (e.g., sudden worsening pain, fever, inability to tolerate by mouth) that necessitate immediate return. Medications administered to the patient during their visit and any new prescriptions provided to the patient are listed below.  Medications given during this visit Medications - No data to display   The patient appears reasonably screen and/or stabilized for discharge and I doubt any other medical condition or other [facility] requiring further screening, evaluation, or treatment in the ED at this time prior to discharge.          Final Clinical Impression(s) / ED Diagnoses Final diagnoses:  Acute left ankle pain    Rx / DC Orders ED Discharge Orders     None         Deno Etienne, DO 05/30/21 0110

## 2021-05-30 NOTE — ED Notes (Signed)
Patient discharged to home.  All discharge instructions reviewed.  Patient verbalized understanding via teachback method.  VS WDL.  Respirations even and unlabored.  Ambulatory out of ED.   °

## 2021-06-02 LAB — OB RESULTS CONSOLE GBS: GBS: NEGATIVE

## 2021-06-16 ENCOUNTER — Telehealth (HOSPITAL_COMMUNITY): Payer: Self-pay | Admitting: *Deleted

## 2021-06-16 ENCOUNTER — Encounter (HOSPITAL_COMMUNITY): Payer: Self-pay | Admitting: *Deleted

## 2021-06-16 NOTE — Telephone Encounter (Signed)
Preadmission screen  

## 2021-06-17 ENCOUNTER — Other Ambulatory Visit: Payer: Self-pay

## 2021-06-18 ENCOUNTER — Telehealth: Payer: Self-pay | Admitting: Pharmacy Technician

## 2021-06-18 NOTE — Telephone Encounter (Addendum)
Auth Submission: PENDING  Payer: [place] MEDICAID  Medication & CPT/J Code(s) submitted: feraheme Route of submission (phone, fax, portal): PORTAL PHONE: [phone] Auth type: Buy/Bill Units/visits requested: 1 Reference number: [id number] W ID# [id number] Q Will update once we receive a response.

## 2021-06-21 ENCOUNTER — Other Ambulatory Visit: Payer: Self-pay

## 2021-06-21 ENCOUNTER — Non-Acute Institutional Stay (HOSPITAL_COMMUNITY)
Admission: RE | Admit: 2021-06-21 | Discharge: 2021-06-21 | Disposition: A | Discharge status: Home / Self Care | Payer: Medicaid Other | Source: Ambulatory Visit | Attending: Internal Medicine | Admitting: Internal Medicine

## 2021-06-21 DIAGNOSIS — Z3A00 Weeks of gestation of pregnancy not specified: Secondary | ICD-10-CM | POA: Insufficient documentation

## 2021-06-21 DIAGNOSIS — D649 Anemia, unspecified: Secondary | ICD-10-CM | POA: Diagnosis not present

## 2021-06-21 DIAGNOSIS — O99019 Anemia complicating pregnancy, unspecified trimester: Secondary | ICD-10-CM | POA: Insufficient documentation

## 2021-06-21 MED ORDER — SODIUM CHLORIDE 0.9 % IV SOLN: INTRAVENOUS | Status: DC | PRN

## 2021-06-21 MED ORDER — SODIUM CHLORIDE 0.9 % IV SOLN
510.0000 mg | Freq: Once | INTRAVENOUS | Status: AC
  Administered 2021-06-21: 510 mg via INTRAVENOUS
  Filled 2021-06-21: qty 17

## 2021-06-21 NOTE — Progress Notes (Signed)
[facility] Note:  Diagnosis: anemia in pregnancy  Provider: Linda Hedges DO  Procedure: Feraheme 510mg   Patient received IV Feraheme ( dose 1 of 2) . No premeds required per orders. Observed for at least 30 minutes post infusion. Tolerated well, vitals stable, discharge instructions given , verbalized understanding. Pt instructed to schedule next infusion in 3 days prior to leaving at front desk, verbalized understanding. Patient alert, oriented, and ambulatory at the time of discharge.

## 2021-06-25 ENCOUNTER — Telehealth: Payer: Self-pay

## 2021-06-25 ENCOUNTER — Encounter (HOSPITAL_COMMUNITY): Payer: Medicaid Other

## 2021-06-25 NOTE — Telephone Encounter (Signed)
Auth Submission: no auth needed Payer: [place] medicaid Medication & CPT/J Code(s) submitted: Feraheme (ferumoxytol) [id number] Route of submission (phone, fax, portal): portal Auth type: Buy/Bill Units/visits requested: 1 Reference number: [id number] Approval from: 06/25/21 to 10/23/21  Patient will be scheduled as soon as possible

## 2021-06-26 ENCOUNTER — Inpatient Hospital Stay (HOSPITAL_COMMUNITY): Admit: 2021-06-26 | Payer: Self-pay

## 2021-06-29 ENCOUNTER — Other Ambulatory Visit: Payer: Self-pay

## 2021-06-29 ENCOUNTER — Inpatient Hospital Stay (HOSPITAL_COMMUNITY)
Admission: AD | Admit: 2021-06-29 | Discharge: 2021-07-01 | DRG: 806 | Disposition: A | Discharge status: Home / Self Care | Payer: Medicaid Other | Attending: Obstetrics & Gynecology | Admitting: Obstetrics & Gynecology

## 2021-06-29 ENCOUNTER — Encounter (HOSPITAL_COMMUNITY): Payer: Self-pay | Admitting: Obstetrics & Gynecology

## 2021-06-29 DIAGNOSIS — Z87891 Personal history of nicotine dependence: Secondary | ICD-10-CM

## 2021-06-29 DIAGNOSIS — O26893 Other specified pregnancy related conditions, third trimester: Secondary | ICD-10-CM | POA: Diagnosis present

## 2021-06-29 DIAGNOSIS — Z3490 Encounter for supervision of normal pregnancy, unspecified, unspecified trimester: Secondary | ICD-10-CM

## 2021-06-29 DIAGNOSIS — Z20822 Contact with and (suspected) exposure to covid-19: Secondary | ICD-10-CM | POA: Diagnosis present

## 2021-06-29 DIAGNOSIS — J45909 Unspecified asthma, uncomplicated: Secondary | ICD-10-CM | POA: Diagnosis present

## 2021-06-29 DIAGNOSIS — O480 Post-term pregnancy: Secondary | ICD-10-CM | POA: Diagnosis present

## 2021-06-29 DIAGNOSIS — O99354 Diseases of the nervous system complicating childbirth: Secondary | ICD-10-CM | POA: Diagnosis present

## 2021-06-29 DIAGNOSIS — Z3A40 40 weeks gestation of pregnancy: Secondary | ICD-10-CM

## 2021-06-29 DIAGNOSIS — G90A Postural orthostatic tachycardia syndrome (POTS): Secondary | ICD-10-CM | POA: Diagnosis present

## 2021-06-29 DIAGNOSIS — O9952 Diseases of the respiratory system complicating childbirth: Secondary | ICD-10-CM | POA: Diagnosis present

## 2021-06-29 HISTORY — DX: Orthostatic hypotension: I95.1

## 2021-06-29 LAB — CBC
HCT: 34.1 % — ABNORMAL LOW (ref 36.0–46.0)
Hemoglobin: 10.9 g/dL — ABNORMAL LOW (ref 12.0–15.0)
MCH: 28.6 pg (ref 26.0–34.0)
MCHC: 32 g/dL (ref 30.0–36.0)
MCV: 89.5 fL (ref 80.0–100.0)
Platelets: 219 10*3/uL (ref 150–400)
RBC: 3.81 MIL/uL — ABNORMAL LOW (ref 3.87–5.11)
RDW: 15.4 % (ref 11.5–15.5)
WBC: 9.7 10*3/uL (ref 4.0–10.5)
nRBC: 0 % (ref 0.0–0.2)

## 2021-06-29 LAB — TYPE AND SCREEN
ABO/RH(D): O POS
Antibody Screen: NEGATIVE

## 2021-06-29 LAB — RESP PANEL BY RT-PCR (FLU A&B, COVID) ARPGX2
Influenza A by PCR: NEGATIVE
Influenza B by PCR: NEGATIVE
SARS Coronavirus 2 by RT PCR: NEGATIVE

## 2021-06-29 MED ORDER — LACTATED RINGERS IV SOLN: INTRAVENOUS | Status: DC

## 2021-06-29 MED ORDER — ACETAMINOPHEN 325 MG PO TABS: 650.0000 mg | ORAL_TABLET | ORAL | Status: DC | PRN

## 2021-06-29 MED ORDER — OXYTOCIN-SODIUM CHLORIDE 30-0.9 UT/500ML-% IV SOLN
2.5000 [IU]/h | INTRAVENOUS | Status: DC
  Filled 2021-06-29: qty 500

## 2021-06-29 MED ORDER — LACTATED RINGERS IV SOLN
500.0000 mL | INTRAVENOUS | Status: DC | PRN
  Administered 2021-06-30: 500 mL via INTRAVENOUS

## 2021-06-29 MED ORDER — TERBUTALINE SULFATE 1 MG/ML IJ SOLN: 0.2500 mg | Freq: Once | INTRAMUSCULAR | Status: DC | PRN

## 2021-06-29 MED ORDER — LACTATED RINGERS IV SOLN: 500.0000 mL | INTRAVENOUS | Status: DC | PRN

## 2021-06-29 MED ORDER — ONDANSETRON HCL 4 MG/2ML IJ SOLN: 4.0000 mg | Freq: Four times a day (QID) | INTRAMUSCULAR | Status: DC | PRN

## 2021-06-29 MED ORDER — LIDOCAINE HCL (PF) 1 % IJ SOLN
30.0000 mL | INTRAMUSCULAR | Status: AC | PRN
  Administered 2021-06-30: 30 mL via SUBCUTANEOUS
  Filled 2021-06-29: qty 30

## 2021-06-29 MED ORDER — FENTANYL CITRATE (PF) 100 MCG/2ML IJ SOLN: 50.0000 ug | INTRAMUSCULAR | Status: DC | PRN

## 2021-06-29 MED ORDER — OXYCODONE-ACETAMINOPHEN 5-325 MG PO TABS
1.0000 | ORAL_TABLET | ORAL | Status: DC | PRN
  Administered 2021-06-30: 1 via ORAL
  Filled 2021-06-29: qty 1

## 2021-06-29 MED ORDER — OXYTOCIN-SODIUM CHLORIDE 30-0.9 UT/500ML-% IV SOLN: 2.5000 [IU]/h | INTRAVENOUS | Status: DC

## 2021-06-29 MED ORDER — BUTORPHANOL TARTRATE 1 MG/ML IJ SOLN: 1.0000 mg | INTRAMUSCULAR | Status: DC | PRN

## 2021-06-29 MED ORDER — SOD CITRATE-CITRIC ACID 500-334 MG/5ML PO SOLN: 30.0000 mL | ORAL | Status: DC | PRN

## 2021-06-29 MED ORDER — MISOPROSTOL 50MCG HALF TABLET: 50.0000 ug | ORAL_TABLET | ORAL | Status: DC

## 2021-06-29 MED ORDER — OXYCODONE-ACETAMINOPHEN 5-325 MG PO TABS: 2.0000 | ORAL_TABLET | ORAL | Status: DC | PRN

## 2021-06-29 MED ORDER — OXYTOCIN BOLUS FROM INFUSION: 333.0000 mL | Freq: Once | INTRAVENOUS | Status: DC

## 2021-06-29 MED ORDER — OXYTOCIN BOLUS FROM INFUSION
333.0000 mL | Freq: Once | INTRAVENOUS | Status: AC
  Administered 2021-06-30: 333 mL via INTRAVENOUS

## 2021-06-29 MED ORDER — LIDOCAINE HCL (PF) 1 % IJ SOLN: 30.0000 mL | INTRAMUSCULAR | Status: DC | PRN

## 2021-06-29 MED ORDER — OXYCODONE-ACETAMINOPHEN 5-325 MG PO TABS: 1.0000 | ORAL_TABLET | ORAL | Status: DC | PRN

## 2021-06-29 MED ORDER — OXYTOCIN-SODIUM CHLORIDE 30-0.9 UT/500ML-% IV SOLN
1.0000 m[IU]/min | INTRAVENOUS | Status: DC
  Administered 2021-06-29: 2 m[IU]/min via INTRAVENOUS

## 2021-06-29 MED ORDER — ACETAMINOPHEN 325 MG PO TABS
650.0000 mg | ORAL_TABLET | ORAL | Status: DC | PRN
  Administered 2021-06-30: 650 mg via ORAL
  Filled 2021-06-29: qty 2

## 2021-06-29 NOTE — H&P (Signed)
Anne [name] is a 26 y.o. female [id number] at [redacted]w[redacted]d presenting for labor.  Patient reports CTX started around 2 am this morning.  She states they have gradually gotten stronger but are still infrequent; q 15 minutes.  No LOF or VB.  Active FM.  Antepartum course complicated by orthostatic hypotensions; s/p negative cards eval.  H/O anemia with admit Hgb 10.9.  Patient has h/o PPD after first delivery.  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  Patient has asthma which has been stable with rescue inhaler prn.  GBS negative.   OB History     Gravida  4   Para  3   Term  3   Preterm      AB      Living  3      SAB      IAB      Ectopic      Multiple  0   Live Births  3          Past Medical History:  Diagnosis Date   Anemia    Anxiety    Anxiety and depression    Asthma    Asthma    Depression    doing good   History of cystitis    Infection    UTI   Orthostatic hypotension    Ovarian cyst    Ovarian cyst    "ruptured in 3rd grade"   Past Surgical History:  Procedure Laterality Date   NO PAST SURGERIES     Family History: family history includes Anxiety disorder in her maternal grandfather, maternal grandmother, and mother; Depression in her maternal grandfather, maternal grandmother, and mother; Headache in her father; Heart disease in her maternal grandfather and paternal grandfather; Hypertension in her maternal grandfather, maternal grandmother, and paternal grandfather; Melanoma in her paternal grandmother; Stroke in her maternal grandfather and maternal grandmother; Thyroid disease in her maternal grandmother. Social History:  reports that she has quit smoking. She has never used smokeless tobacco. She reports that she does not currently use alcohol. She reports that she does not use drugs.     Maternal Diabetes: No Genetic Screening: Normal Maternal Ultrasounds/Referrals: Normal Fetal Ultrasounds or other Referrals:  None Maternal  Substance Abuse:  No Significant Maternal Medications:  None Significant Maternal Lab Results:  Group B Strep negative Other Comments:  None  Review of Systems Maternal Medical History:  Reason for admission: Contractions.   Contractions: Onset was 13-24 hours ago.   Frequency: irregular.   Perceived severity is moderate.   Fetal activity: Perceived fetal activity is normal.   Last perceived fetal movement was within the past hour.   Prenatal complications: no prenatal complications Prenatal Complications - Diabetes: none.  Dilation: 4 Effacement (%): 70 Station: -3 Exam by:: n druebbisch rn Blood pressure 122/78, pulse 98, temperature 98.3 F (36.8 C), temperature source Oral, resp. rate 15, height 5' 0.5" (1.537 m), weight 67.3 kg, last menstrual period 10/26/2020, SpO2 100 %, unknown if currently breastfeeding. Maternal Exam:  Uterine Assessment: Contraction strength is moderate.  Contraction frequency is irregular.  Abdomen: Patient reports no abdominal tenderness. Fundal height is c/w dates.   Estimated fetal weight is 8#.     Fetal Exam Fetal Monitor Review: Baseline rate: 140.  Variability: moderate (6-25 bpm).   Pattern: accelerations present and no decelerations.   Fetal State Assessment: Category I - tracings are normal.  Physical Exam Constitutional:      Appearance: Normal appearance.  HENT:  Head: Normocephalic and atraumatic.  Pulmonary:     Effort: Pulmonary effort is normal.  Abdominal:     Palpations: Abdomen is soft.  Musculoskeletal:        General: Normal range of motion.     Cervical back: Normal range of motion.  Skin:    General: Skin is warm and dry.  Neurological:     Mental Status: She is alert and oriented to person, place, and time.  Psychiatric:        Mood and Affect: Mood normal.        Behavior: Behavior normal.    Prenatal labs: ABO, Rh: --/--/PENDING (02/14 1848) Antibody: PENDING (02/14 1848) Rubella: Immune (07/05  0000) RPR: Nonreactive (07/05 0000)  HBsAg: Negative (07/05 0000)  HIV: Non-reactive (07/05 0000)  GBS: Negative/-- (01/18 0000)   Assessment/Plan: [id number] at [redacted]w[redacted]d with labor -Patient is planning unmedicated delivery -Will AROM or add pitocin as needed -Anticipate NSVD   Linda Hedges 06/29/2021, 7:26 PM

## 2021-06-29 NOTE — MAU Note (Addendum)
...  Anne [name] is a 27 y.o. at [redacted]w[redacted]d here in MAU reporting: CTX since after midnight. She states she is currently having around 4-5 in an hour. Last CTX was 20 minutes ago. +FM. No VB or LOF but states she has been losing pieces of her mucous plug today and states there is an odor.   Was 2-3 cm last week in office on 2/7. GBS-. Scheduled for an induction tonight at midnight.  Pain score: 8/10 lower abdomen  FHT: 145 initial external Lab orders placed from triage:  MAU Labor Eval

## 2021-06-30 ENCOUNTER — Inpatient Hospital Stay (HOSPITAL_COMMUNITY)
Admission: AD | Admit: 2021-06-30 | Payer: Medicaid Other | Source: Home / Self Care | Admitting: Obstetrics and Gynecology

## 2021-06-30 ENCOUNTER — Encounter (HOSPITAL_COMMUNITY): Payer: Self-pay | Admitting: Obstetrics and Gynecology

## 2021-06-30 ENCOUNTER — Inpatient Hospital Stay (HOSPITAL_COMMUNITY): Payer: Medicaid Other

## 2021-06-30 LAB — RPR: RPR Ser Ql: NONREACTIVE

## 2021-06-30 MED ORDER — PRENATAL MULTIVITAMIN CH
1.0000 | ORAL_TABLET | Freq: Every day | ORAL | Status: DC
  Administered 2021-06-30 – 2021-07-01 (×2): 1 via ORAL
  Filled 2021-06-30 (×2): qty 1

## 2021-06-30 MED ORDER — ONDANSETRON HCL 4 MG PO TABS: 4.0000 mg | ORAL_TABLET | ORAL | Status: DC | PRN

## 2021-06-30 MED ORDER — METHYLERGONOVINE MALEATE 0.2 MG PO TABS: 0.2000 mg | ORAL_TABLET | ORAL | Status: DC | PRN

## 2021-06-30 MED ORDER — DIBUCAINE (PERIANAL) 1 % EX OINT: 1.0000 "application " | TOPICAL_OINTMENT | CUTANEOUS | Status: DC | PRN

## 2021-06-30 MED ORDER — TETANUS-DIPHTH-ACELL PERTUSSIS 5-2.5-18.5 LF-MCG/0.5 IM SUSY: 0.5000 mL | PREFILLED_SYRINGE | Freq: Once | INTRAMUSCULAR | Status: DC

## 2021-06-30 MED ORDER — SIMETHICONE 80 MG PO CHEW: 80.0000 mg | CHEWABLE_TABLET | ORAL | Status: DC | PRN

## 2021-06-30 MED ORDER — WITCH HAZEL-GLYCERIN EX PADS: 1.0000 "application " | MEDICATED_PAD | CUTANEOUS | Status: DC | PRN

## 2021-06-30 MED ORDER — TRANEXAMIC ACID-NACL 1000-0.7 MG/100ML-% IV SOLN
INTRAVENOUS | Status: AC
  Filled 2021-06-30: qty 100

## 2021-06-30 MED ORDER — DIPHENHYDRAMINE HCL 25 MG PO CAPS: 25.0000 mg | ORAL_CAPSULE | Freq: Four times a day (QID) | ORAL | Status: DC | PRN

## 2021-06-30 MED ORDER — ZOLPIDEM TARTRATE 5 MG PO TABS: 5.0000 mg | ORAL_TABLET | Freq: Every evening | ORAL | Status: DC | PRN

## 2021-06-30 MED ORDER — METHYLERGONOVINE MALEATE 0.2 MG/ML IJ SOLN: 0.2000 mg | INTRAMUSCULAR | Status: DC | PRN

## 2021-06-30 MED ORDER — TRANEXAMIC ACID-NACL 1000-0.7 MG/100ML-% IV SOLN
1000.0000 mg | INTRAVENOUS | Status: AC
  Administered 2021-06-30: 1000 mg via INTRAVENOUS

## 2021-06-30 MED ORDER — METHYLERGONOVINE MALEATE 0.2 MG/ML IJ SOLN
INTRAMUSCULAR | Status: AC
  Administered 2021-06-30: 0.2 mg via INTRAMUSCULAR
  Filled 2021-06-30: qty 1

## 2021-06-30 MED ORDER — ONDANSETRON HCL 4 MG/2ML IJ SOLN: 4.0000 mg | INTRAMUSCULAR | Status: DC | PRN

## 2021-06-30 MED ORDER — BENZOCAINE-MENTHOL 20-0.5 % EX AERO: 1.0000 "application " | INHALATION_SPRAY | CUTANEOUS | Status: DC | PRN

## 2021-06-30 MED ORDER — IBUPROFEN 600 MG PO TABS
600.0000 mg | ORAL_TABLET | Freq: Four times a day (QID) | ORAL | Status: DC
  Administered 2021-06-30 – 2021-07-01 (×5): 600 mg via ORAL
  Filled 2021-06-30 (×5): qty 1

## 2021-06-30 MED ORDER — SENNOSIDES-DOCUSATE SODIUM 8.6-50 MG PO TABS
2.0000 | ORAL_TABLET | Freq: Every day | ORAL | Status: DC
  Administered 2021-07-01: 2 via ORAL
  Filled 2021-06-30: qty 2

## 2021-06-30 MED ORDER — ALBUTEROL SULFATE (2.5 MG/3ML) 0.083% IN NEBU
2.5000 mg | INHALATION_SOLUTION | Freq: Four times a day (QID) | RESPIRATORY_TRACT | Status: DC | PRN
  Administered 2021-06-30: 2.5 mg via RESPIRATORY_TRACT
  Filled 2021-06-30: qty 3

## 2021-06-30 MED ORDER — COCONUT OIL OIL: 1.0000 "application " | TOPICAL_OIL | Status: DC | PRN

## 2021-06-30 MED ORDER — ACETAMINOPHEN 325 MG PO TABS: 650.0000 mg | ORAL_TABLET | ORAL | Status: DC | PRN

## 2021-06-30 NOTE — Progress Notes (Signed)
RN called to inform me that patient has small trickle of blood off and on.  I was finishing up a repair and ordered TXA and methergine.  I arrived to the patient's room about 10 minutes later and she is feeling well and VSS.   Abd: firm fundus 2 below the umbilicus.  No clots expelled on exam; normal lochia  Will continue to closely monitor.  Mitchel Honour, DO

## 2021-06-30 NOTE — Progress Notes (Signed)
Post Partum Day 0 Subjective: no complaints, up ad lib, voiding, and tolerating PO  Objective: Blood pressure 120/79, pulse 79, temperature 98 F (36.7 C), temperature source Oral, resp. rate 16, height 5' 0.5" (1.537 m), weight 67.3 kg, last menstrual period 10/26/2020, SpO2 99 %, unknown if currently breastfeeding.  Physical Exam:  General: alert, cooperative, appears stated age, and no distress Lochia: appropriate Uterine Fundus: firm Incision:   DVT Evaluation: No evidence of DVT seen on physical exam.  Recent Labs    06/29/21 1853  HGB 10.9*  HCT 34.1*    Assessment/Plan: Breastfeeding Doing well Does not want her son circumcised   LOS: 1 day   Turner Daniels 06/30/2021, 9:41 AM

## 2021-07-01 ENCOUNTER — Ambulatory Visit: Payer: BC Managed Care – PPO | Admitting: Neurology

## 2021-07-01 LAB — CBC
HCT: 30.4 % — ABNORMAL LOW (ref 36.0–46.0)
Hemoglobin: 9.5 g/dL — ABNORMAL LOW (ref 12.0–15.0)
MCH: 28.9 pg (ref 26.0–34.0)
MCHC: 31.3 g/dL (ref 30.0–36.0)
MCV: 92.4 fL (ref 80.0–100.0)
Platelets: 194 10*3/uL (ref 150–400)
RBC: 3.29 MIL/uL — ABNORMAL LOW (ref 3.87–5.11)
RDW: 15.7 % — ABNORMAL HIGH (ref 11.5–15.5)
WBC: 8.1 10*3/uL (ref 4.0–10.5)
nRBC: 0 % (ref 0.0–0.2)

## 2021-07-01 MED ORDER — IBUPROFEN 600 MG PO TABS: 600.0000 mg | ORAL_TABLET | Freq: Four times a day (QID) | ORAL | 0 refills | Status: DC | PRN

## 2021-07-01 NOTE — Lactation Note (Signed)
This note was copied from a baby's chart. Lactation Consultation Note Baby has been very sleepy not BF well at all, not feeding during the night.  Mom had 24 yr old that she exclusively pumped d/t cleft palate and had to bottle feed. Mom pumped 2 months. Her 2nd child now 57 yrs old mom BF for 3 months using NS. Her 3rd child now 54 yr old mom BF for 2 months. Mom has small nipples. Fitted size 21 flanges for pump and #20 NS fit well. Baby latched great and swallowing at the breast.good transfer noted. Mom encouraged to feed baby 8-12 times/24 hours and with feeding cues.  Mom shown how to use DEBP & how to disassemble, clean, & reassemble parts. Mom knows to pump q3h for 15-20 min. Mom agreed to pump and if she collects anything and hand expresses after pumping to give back to the baby. Newborn feeding habits, STS, I&O, supply and demand discussed. Encouraged to call for assistance. Encouraged mom to pump and supplement d/t significant wt. Loss.  Patient Name: Boy Nettie Wyffels NOBSJ'G Date: 07/01/2021 Reason for consult: Initial assessment;Term;Infant weight loss Age:70 hours  Maternal Data Has patient been taught Hand Expression?: Yes Does the patient have breastfeeding experience prior to this delivery?: Yes How long did the patient breastfeed?: 24 yr old pumped 2 months, 24 yr old BF 3 months, 24 yr old 2 months  Feeding    LATCH Score Latch: Grasps breast easily, tongue down, lips flanged, rhythmical sucking.  Audible Swallowing: A few with stimulation  Type of Nipple: Flat (semi flat)  Comfort (Breast/Nipple): Soft / non-tender  Hold (Positioning): Assistance needed to correctly position infant at breast and maintain latch.  LATCH Score: 7   Lactation Tools Discussed/Used Tools: Shells;Pump;Flanges;Nipple Shields Nipple shield size: 20 Flange Size: 21 Breast pump type: Double-Electric Breast Pump Pump Education: Setup, frequency, and cleaning;Milk Storage Reason for Pumping:  NS/wt. loss Pumping frequency: q 3hr  Interventions Interventions: Breast feeding basics reviewed;Assisted with latch;Skin to skin;Breast massage;Hand express;Pre-pump if needed;Breast compression;Adjust position;Support pillows;Position options;Shells;DEBP;LC Services brochure  Discharge    Consult Status Consult Status: Follow-up Date: 07/01/21 (see in pm) Follow-up type: In-patient    Charyl Dancer 07/01/2021, 5:48 AM

## 2021-07-01 NOTE — Lactation Note (Signed)
This note was copied from a baby's chart. Lactation Consultation Note  Patient Name: Anne [name] DGLOV'F Date: 07/01/2021 Reason for consult: Follow-up assessment;Term Age:24 hours   P4 mother whose infant is now 64 hours old.  This is a term baby at 40+4 weeks.  Mother has breast feeding experience; her youngest is one year old.  Mother's current feeding preference is breast.  Mother had no questions/concerns related to breast feeding.  She feels like "Anne [name]" is latching/feeding well.  Last LATCH score was a 7.He has had multiple voids/stools. Mother is familiar with engorgement and did not wish to review.  She has a manual pump and a DEBP for home use.  No support person present at this time.  Baby has been discharged.  Mother has our OP phone number for any concerns after discharge.   Maternal Data    Feeding Mother's Current Feeding Choice: Breast Milk  LATCH Score                    Lactation Tools Discussed/Used    Interventions Interventions: Breast feeding basics reviewed;Education;Shells;DEBP;Hand pump  Discharge Discharge Education: Engorgement and breast care Pump: Personal;Manual;DEBP (wireless)  Consult Status Consult Status: Complete Date: 07/01/21 Follow-up type: Call as needed    Chanel Mckesson R Dominick Morella 07/01/2021, 9:48 AM

## 2021-07-01 NOTE — Discharge Summary (Signed)
Postpartum Discharge Summary  Date of Service updated 07/01/21     Patient Name: Anne [name] DOB: May 26, 1995 MRN: [id number]  Date of admission: 06/29/2021 Delivery date:06/30/2021  Delivering provider: Linda Hedges  Date of discharge: 07/01/2021  Admitting diagnosis: Pregnancy [Z34.90] Postural orthostatic tachycardia syndrome [G90.A] Intrauterine pregnancy: [redacted]w[redacted]d    Secondary diagnosis:  Principal Problem:   Pregnancy Active Problems:   Postural orthostatic tachycardia syndrome  Additional problems:     Discharge diagnosis: Term Pregnancy Delivered                                              Post partum procedures: Augmentation:  Complications: None  Hospital course: Onset of Labor With Vaginal Delivery      26y.o. yo [id number] admitted in Active Labor on 06/29/2021. Patient had an uncomplicated labor course as follows:  Membrane Rupture Time/Date: 11:48 PM ,06/29/2021   Delivery Method:Vaginal, Spontaneous  Episiotomy: None  Lacerations:  1st degree  Patient had an uncomplicated postpartum course.  She is ambulating, tolerating a regular diet, passing flatus, and urinating well. Patient is discharged home in stable condition on 07/01/21.  Newborn Data: Birth date:06/30/2021  Birth time:12:51 AM  Gender:Female  Living status:Living  Apgars:9 ,9  Weight:3120 g   Magnesium Sulfate received: No BMZ received: Yes Rhophylac:No MMR:No T-DaP:Given prenatally Flu: No Transfusion:No  Physical exam  Vitals:   06/30/21 0914 06/30/21 1353 06/30/21 1943 07/01/21 0544  BP: 120/79 106/83 119/75 104/71  Pulse: 79 69 73 65  Resp: '16 16 18 18  ' Temp: 98 F (36.7 C) 98 F (36.7 C) 97.8 F (36.6 C) 97.9 F (36.6 C)  TempSrc: Oral Oral Oral Oral  SpO2:   99% 98%  Weight:      Height:       General: alert, cooperative, and no distress Lochia: appropriate Uterine Fundus: firm Incision: Healing well with no significant drainage DVT Evaluation: No evidence  of DVT seen on physical exam. Labs: Lab Results  Component Value Date   WBC 9.7 06/29/2021   HGB 10.9 (L) 06/29/2021   HCT 34.1 (L) 06/29/2021   MCV 89.5 06/29/2021   PLT 219 06/29/2021   CMP Latest Ref Rng & Units 04/06/2018  Glucose 70 - 99 mg/dL 91  BUN 6 - 20 mg/dL <5(L)  Creatinine 0.44 - 1.00 mg/dL 0.35(L)  Sodium 135 - 145 mmol/L 134(L)  Potassium 3.5 - 5.1 mmol/L 3.2(L)  Chloride 98 - 111 mmol/L 106  CO2 22 - 32 mmol/L 21(L)  Calcium 8.9 - 10.3 mg/dL 8.3(L)  Total Protein 6.5 - 8.1 g/dL 6.5  Total Bilirubin 0.3 - 1.2 mg/dL 0.5  Alkaline Phos 38 - 126 U/L 67  AST 15 - 41 U/L 14(L)  ALT 0 - 44 U/L 10   Edinburgh Score: Edinburgh Postnatal Depression Scale Screening Tool 06/30/2021  I have been able to laugh and see the funny side of things. 0  I have looked forward with enjoyment to things. 0  I have blamed myself unnecessarily when things went wrong. 3  I have been anxious or worried for no good reason. 0  I have felt scared or panicky for no good reason. 0  Things have been getting on top of me. 0  I have been so unhappy that I have had difficulty sleeping. 0  I have felt  sad or miserable. 0  I have been so unhappy that I have been crying. 1  The thought of harming myself has occurred to me. 0  Edinburgh Postnatal Depression Scale Total 4      After visit meds:  Allergies as of 07/01/2021   No Known Allergies      Medication List     TAKE these medications    acetaminophen 325 MG tablet Commonly known as: Tylenol Take 2 tablets (650 mg total) by mouth every 6 (six) hours as needed (for pain scale < 4).   albuterol 108 (90 Base) MCG/ACT inhaler Commonly known as: VENTOLIN HFA Inhale 1-2 puffs into the lungs every 6 (six) hours as needed for wheezing or shortness of breath.   ibuprofen 600 MG tablet Commonly known as: ADVIL Take 1 tablet (600 mg total) by mouth every 6 (six) hours as needed.   prenatal multivitamin Tabs tablet Take 1 tablet by  mouth daily at 12 noon.         Discharge home in stable condition Infant Feeding: Breast Infant Disposition:home with mother Discharge instruction: per After Visit Summary and Postpartum booklet. Activity: Advance as tolerated. Pelvic rest for 6 weeks.  Diet: routine diet Anticipated Birth Control: Unsure Postpartum Appointment:6 weeks Additional Postpartum F/U:  Future Appointments: Future Appointments  Date Time Provider [place]  07/01/2021  1:30 PM Dohmeier, Asencion Partridge, MD [facility] None   Follow up Visit:      07/01/2021 Allena Katz, MD

## 2021-07-02 ENCOUNTER — Encounter (HOSPITAL_COMMUNITY): Payer: Self-pay | Admitting: Obstetrics and Gynecology

## 2021-07-02 ENCOUNTER — Inpatient Hospital Stay (HOSPITAL_COMMUNITY)
Admission: AD | Admit: 2021-07-02 | Discharge: 2021-07-02 | Disposition: A | Discharge status: Home / Self Care | Payer: Medicaid Other | Attending: Obstetrics and Gynecology | Admitting: Obstetrics and Gynecology

## 2021-07-02 ENCOUNTER — Other Ambulatory Visit: Payer: Self-pay

## 2021-07-02 NOTE — MAU Provider Note (Signed)
Event Date/Time   First Provider Initiated Contact with Patient 07/02/21 1348     S [name] is a 26 y.o. [phone] non-pregnant female at PPD#2 who presents to MAU today with complaint of heavy vaginal bleeding. She'd had very little bleeding since delivery. Was nursing off and on for two hours today and then got up to pee and had a large gush of blood with clots and filled two pads almost completely in . Bleeding has slowed considerably since arrival to MAU. Denies excessive cramping or fever. No other physical complaints.  Receives care at [facility], delivery record/hospital stay reviewed. She had a SVD with a trickle of bleeding after delivery that was resolved with methergine and TXA. Total blood loss was .  O BP 113/65 (BP Location: Left Arm)    Pulse 73    Temp 98.5 F (36.9 C) (Oral)    Resp 18    Ht 5\' 1"  (1.549 m)    Wt 146 lb 3.2 oz (66.3 kg)    SpO2 99% Comment: room air   BMI 27.62 kg/m  Physical Exam Vitals and nursing note reviewed.  Constitutional:      General: She is not in acute distress.    Appearance: Normal appearance. She is normal weight. She is not ill-appearing or diaphoretic.  HENT:     Head: Normocephalic and atraumatic.  Eyes:     Pupils: Pupils are equal, round, and reactive to light.  Cardiovascular:     Rate and Rhythm: Normal rate and regular rhythm.     Pulses: Normal pulses.  Pulmonary:     Effort: Pulmonary effort is normal.  Abdominal:     Palpations: Abdomen is soft.     Tenderness: There is no abdominal tenderness.  Genitourinary:    General: Normal vulva.     Comments: Bleeding appropriate for PPD#2 Musculoskeletal:        General: Normal range of motion.  Skin:    General: Skin is warm.     Capillary Refill: Capillary refill takes less than 2 seconds.  Neurological:     Mental Status: She is alert and oriented to person, place, and time.  Psychiatric:        Mood and Affect: Mood normal.         Behavior: Behavior normal.        Thought Content: Thought content normal.        Judgment: Judgment normal.   MAU Course/MDM: Assessed bleeding immediately following nursing session, very appropriate. Provided education and reassurance on appropriateness of bleeding, shedding of eschar, how breastfeeding sessions cause cramping and likelihood that some of her initial bleeding was likely slowed because of the meds given at delivery. Reviewed signs of inappropriate bleeding and encouraged her to return if she has a large gush with clots that does not stop after 1-2hrs. Pt understanding.  A Postpartum bleeding Stable for discharge home  P Discharge from MAU in stable condition with return precautions Follow up at [facility] as scheduled for postpartum follow up Warning signs for worsening condition that would warrant emergency follow-up discussed  [facility] 07/02/2021 3:16 PM

## 2021-07-02 NOTE — Progress Notes (Signed)
Patient given discharge instructions and reviewed normal vs abnormal lochia postpartum. Patient verbalizes understanding. Ambulating with significant other, discharged home.

## 2021-07-02 NOTE — MAU Note (Signed)
Presents with c/o heavy VB that began today @ noon.  States she saturated 3/4 of 2 sanitary napkins in less than 10 minutes.  States passing small to medium sized clots.  Reports hadn't had much VB since NVD on 06/30/2021.   Also c/o bilateral thigh & neck pain.

## 2021-07-09 ENCOUNTER — Telehealth (HOSPITAL_COMMUNITY): Payer: Self-pay

## 2021-07-09 NOTE — Telephone Encounter (Signed)
No answer. Mailbox is full.   Anne [name] [facility] 07/09/2021,1931

## 2021-08-10 DIAGNOSIS — Z1389 Encounter for screening for other disorder: Secondary | ICD-10-CM | POA: Diagnosis not present

## 2021-08-15 DIAGNOSIS — R0603 Acute respiratory distress: Secondary | ICD-10-CM | POA: Diagnosis not present

## 2021-08-15 DIAGNOSIS — J211 Acute bronchiolitis due to human metapneumovirus: Secondary | ICD-10-CM | POA: Diagnosis not present

## 2021-08-15 DIAGNOSIS — E860 Dehydration: Secondary | ICD-10-CM | POA: Diagnosis not present

## 2021-08-15 DIAGNOSIS — R6251 Failure to thrive (child): Secondary | ICD-10-CM | POA: Diagnosis not present

## 2021-08-16 ENCOUNTER — Ambulatory Visit: Payer: Self-pay

## 2021-08-16 DIAGNOSIS — J211 Acute bronchiolitis due to human metapneumovirus: Secondary | ICD-10-CM | POA: Diagnosis not present

## 2021-08-16 NOTE — Lactation Note (Signed)
This note was copied from a baby's chart. ?Lactation Consultation Note ? ?Patient Name: Anne [name] ?Today's Date: 08/16/2021 ?Reason for consult: Other (Comment);Follow-up assessment (Peds readmit) ?Age:24 wk.o. ? ?Visited with mom of 35 weeks old FT female, she asked for a lactation consult, she's been continuing breastfeeding since Peds admission. Mom and baby getting discharge today. Reviewed discharge education, encouraged her to continue pumping after feedings despite of small amounts obtained. Advised mom to "double feed" baby by topping off with a bottle of 1-2 oz of EBM/formula after feedings at the breast to minimize weight loss and improve weight gain. Baby has had two respiratory infections the last 2 weeks (back to back) which has affected his feeding. ? ?Provided resources for [facility] OP consultation, support group and LC warm line at our hospital. Ms. Laster voiced she's been working with the [facility] at baby's pediatrician's office where she was told baby had a tongue tie but it's doesn't interfere with baby's ability to feed at the breast; slight nipple pain that is manageable with repositioning baby. Mom agreeable with feeding plan, all questions and concerns answered, mom to contact lactation services PRN. ? ?Feeding ?Mother's Current Feeding Choice: Breast Milk and Formula ?Nipple Type: Regular ? ?Lactation Tools Discussed/Used ?Tools: Pump ?Breast pump type: Double-Electric Breast Pump ?Pump Education: Setup, frequency, and cleaning ?Reason for Pumping: Peds admission ?Pumping frequency: 5-6 times/24 hours ?Pumped volume: 10 mL (10 ml. after feeding baby, 60 ml if baby didn't nurse) ? ?Interventions ?Interventions: DEBP;Education;Breast feeding basics reviewed ? ?Discharge ?Pump: DEBP;Personal Margarette [place]) ? ?Consult Status ?Consult Status: Complete ?Date: 08/16/21 ?Follow-up type: Call as needed ? ? ?Leontyne Manville S Oshen Wlodarczyk ?08/16/2021, 5:53 PM ? ? ? ?

## 2021-08-23 DIAGNOSIS — M791 Myalgia, unspecified site: Secondary | ICD-10-CM | POA: Diagnosis not present

## 2021-08-23 DIAGNOSIS — J029 Acute pharyngitis, unspecified: Secondary | ICD-10-CM | POA: Diagnosis not present

## 2021-08-23 DIAGNOSIS — R059 Cough, unspecified: Secondary | ICD-10-CM | POA: Diagnosis not present

## 2021-10-12 DIAGNOSIS — J020 Streptococcal pharyngitis: Secondary | ICD-10-CM | POA: Diagnosis not present

## 2021-10-24 DIAGNOSIS — J020 Streptococcal pharyngitis: Secondary | ICD-10-CM | POA: Diagnosis not present

## 2022-01-03 DIAGNOSIS — J020 Streptococcal pharyngitis: Secondary | ICD-10-CM | POA: Diagnosis not present

## 2022-01-25 ENCOUNTER — Emergency Department (HOSPITAL_COMMUNITY)
Admission: EM | Admit: 2022-01-25 | Discharge: 2022-01-25 | Disposition: A | Discharge status: Home / Self Care | Payer: BC Managed Care – PPO | Attending: Emergency Medicine | Admitting: Emergency Medicine

## 2022-01-25 ENCOUNTER — Encounter (HOSPITAL_COMMUNITY): Payer: Self-pay | Admitting: Emergency Medicine

## 2022-01-25 ENCOUNTER — Emergency Department (HOSPITAL_COMMUNITY): Payer: BC Managed Care – PPO

## 2022-01-25 DIAGNOSIS — R110 Nausea: Secondary | ICD-10-CM | POA: Diagnosis not present

## 2022-01-25 DIAGNOSIS — N200 Calculus of kidney: Secondary | ICD-10-CM

## 2022-01-25 DIAGNOSIS — N132 Hydronephrosis with renal and ureteral calculous obstruction: Secondary | ICD-10-CM | POA: Diagnosis not present

## 2022-01-25 DIAGNOSIS — R112 Nausea with vomiting, unspecified: Secondary | ICD-10-CM | POA: Diagnosis not present

## 2022-01-25 DIAGNOSIS — R1084 Generalized abdominal pain: Secondary | ICD-10-CM | POA: Diagnosis not present

## 2022-01-25 DIAGNOSIS — R109 Unspecified abdominal pain: Secondary | ICD-10-CM | POA: Diagnosis present

## 2022-01-25 DIAGNOSIS — D72829 Elevated white blood cell count, unspecified: Secondary | ICD-10-CM | POA: Insufficient documentation

## 2022-01-25 DIAGNOSIS — I10 Essential (primary) hypertension: Secondary | ICD-10-CM | POA: Diagnosis not present

## 2022-01-25 DIAGNOSIS — M549 Dorsalgia, unspecified: Secondary | ICD-10-CM | POA: Diagnosis not present

## 2022-01-25 LAB — CBC WITH DIFFERENTIAL/PLATELET
Abs Immature Granulocytes: 0.03 10*3/uL (ref 0.00–0.07)
Basophils Absolute: 0 10*3/uL (ref 0.0–0.1)
Basophils Relative: 0 %
Eosinophils Absolute: 0 10*3/uL (ref 0.0–0.5)
Eosinophils Relative: 0 %
HCT: 39.8 % (ref 36.0–46.0)
Hemoglobin: 13.2 g/dL (ref 12.0–15.0)
Immature Granulocytes: 0 %
Lymphocytes Relative: 8 %
Lymphs Abs: 1.1 10*3/uL (ref 0.7–4.0)
MCH: 30.5 pg (ref 26.0–34.0)
MCHC: 33.2 g/dL (ref 30.0–36.0)
MCV: 91.9 fL (ref 80.0–100.0)
Monocytes Absolute: 0.5 10*3/uL (ref 0.1–1.0)
Monocytes Relative: 4 %
Neutro Abs: 11.1 10*3/uL — ABNORMAL HIGH (ref 1.7–7.7)
Neutrophils Relative %: 88 %
Platelets: 325 10*3/uL (ref 150–400)
RBC: 4.33 MIL/uL (ref 3.87–5.11)
RDW: 12.4 % (ref 11.5–15.5)
WBC: 12.8 10*3/uL — ABNORMAL HIGH (ref 4.0–10.5)
nRBC: 0 % (ref 0.0–0.2)

## 2022-01-25 LAB — COMPREHENSIVE METABOLIC PANEL
ALT: 21 U/L (ref 0–44)
AST: 28 U/L (ref 15–41)
Albumin: 4 g/dL (ref 3.5–5.0)
Alkaline Phosphatase: 71 U/L (ref 38–126)
Anion gap: 7 (ref 5–15)
BUN: 13 mg/dL (ref 6–20)
CO2: 22 mmol/L (ref 22–32)
Calcium: 9.1 mg/dL (ref 8.9–10.3)
Chloride: 110 mmol/L (ref 98–111)
Creatinine, Ser: 0.78 mg/dL (ref 0.44–1.00)
GFR, Estimated: >60 mL/min (ref >60)
Glucose, Bld: 126 mg/dL — ABNORMAL HIGH (ref 70–99)
Potassium: 3.8 mmol/L (ref 3.5–5.1)
Sodium: 139 mmol/L (ref 135–145)
Total Bilirubin: 0.6 mg/dL (ref 0.3–1.2)
Total Protein: 7.2 g/dL (ref 6.5–8.1)

## 2022-01-25 LAB — URINALYSIS, ROUTINE W REFLEX MICROSCOPIC
Bacteria, UA: NONE SEEN
Bilirubin Urine: NEGATIVE
Glucose, UA: NEGATIVE mg/dL
Ketones, ur: NEGATIVE mg/dL
Leukocytes,Ua: NEGATIVE
Nitrite: NEGATIVE
Protein, ur: 30 mg/dL — AB
RBC / HPF: >50 RBC/hpf — ABNORMAL HIGH (ref 0–5)
Specific Gravity, Urine: 1.023 (ref 1.005–1.030)
pH: 8 (ref 5.0–8.0)

## 2022-01-25 LAB — I-STAT BETA HCG BLOOD, ED (MC, WL, AP ONLY): I-stat hCG, quantitative: <5 m[IU]/mL (ref <5)

## 2022-01-25 MED ORDER — OXYCODONE-ACETAMINOPHEN 5-325 MG PO TABS: 1.0000 | ORAL_TABLET | Freq: Four times a day (QID) | ORAL | 0 refills | Status: DC | PRN

## 2022-01-25 MED ORDER — OXYCODONE-ACETAMINOPHEN 5-325 MG PO TABS
1.0000 | ORAL_TABLET | Freq: Once | ORAL | Status: AC
  Administered 2022-01-25: 1 via ORAL
  Filled 2022-01-25: qty 1

## 2022-01-25 MED ORDER — ONDANSETRON HCL 4 MG/2ML IJ SOLN
4.0000 mg | Freq: Once | INTRAMUSCULAR | Status: AC
  Administered 2022-01-25: 4 mg via INTRAVENOUS
  Filled 2022-01-25: qty 2

## 2022-01-25 MED ORDER — KETOROLAC TROMETHAMINE 30 MG/ML IJ SOLN
15.0000 mg | Freq: Once | INTRAMUSCULAR | Status: AC
  Administered 2022-01-25: 15 mg via INTRAVENOUS
  Filled 2022-01-25: qty 1

## 2022-01-25 MED ORDER — HYDROMORPHONE HCL 1 MG/ML IJ SOLN
0.5000 mg | Freq: Once | INTRAMUSCULAR | Status: AC
  Administered 2022-01-25: 0.5 mg via INTRAVENOUS
  Filled 2022-01-25: qty 1

## 2022-01-25 MED ORDER — SODIUM CHLORIDE 0.9 % IV BOLUS
1000.0000 mL | Freq: Once | INTRAVENOUS | Status: AC
  Administered 2022-01-25: 1000 mL via INTRAVENOUS

## 2022-01-25 MED ORDER — MORPHINE SULFATE (PF) 4 MG/ML IV SOLN
4.0000 mg | Freq: Once | INTRAVENOUS | Status: AC
  Administered 2022-01-25: 4 mg via INTRAVENOUS
  Filled 2022-01-25: qty 1

## 2022-01-25 MED ORDER — ONDANSETRON 4 MG PO TBDP: 4.0000 mg | ORAL_TABLET | Freq: Three times a day (TID) | ORAL | 0 refills | Status: DC | PRN

## 2022-01-25 NOTE — ED Triage Notes (Addendum)
EMS stated, pt N/V and back pain for 3 days. Unable to find a comfortable position. IV 20 g Rt. AC 1001 give 22mg . Of katamine

## 2022-01-25 NOTE — ED Provider Notes (Signed)
[facility] EMERGENCY DEPARTMENT Provider Note   CSN: [id number] Arrival date & time: 01/25/22  1035     History  No chief complaint on file.   Anne [name] is a 26 y.o. female.  HPI Patient presents with right-sided flank pain.  Pain awoke her this morning from sleep at 630, approximately 6 hours prior to ED arrival.  She reports that this feels both like her prior kidney stone and like giving birth.  There is associated crampy sensation anteriorly, nausea, vomiting, but no fever, no inability to urinate.    Home Medications Prior to Admission medications   Medication Sig Start Date End Date Taking? Authorizing Provider  acetaminophen (TYLENOL) 325 MG tablet Take 2 tablets (650 mg total) by mouth every 6 (six) hours as needed (for pain scale < 4). 04/02/20   Harold Hedge, MD  albuterol (PROVENTIL HFA;VENTOLIN HFA) 108 (90 Base) MCG/ACT inhaler Inhale 1-2 puffs into the lungs every 6 (six) hours as needed for wheezing or shortness of breath.     [provider]  ibuprofen (ADVIL) 600 MG tablet Take 1 tablet (600 mg total) by mouth every 6 (six) hours as needed. 07/01/21   Harold Hedge, MD  Prenatal Vit-Fe Fumarate-FA (PRENATAL MULTIVITAMIN) TABS tablet Take 1 tablet by mouth daily at 12 noon.     [provider]      Allergies    Patient has no known allergies.    Review of Systems   Review of Systems  All other systems reviewed and are negative.   Physical Exam Updated Vital Signs BP 127/71 (BP Location: Left Arm)   Pulse 73   Temp 98.4 F (36.9 C) (Oral)   Resp 18   SpO2 97%  Physical Exam Vitals and nursing note reviewed.  Constitutional:      General: She is not in acute distress.    Appearance: She is well-developed.  HENT:     Head: Normocephalic and atraumatic.  Eyes:     Conjunctiva/sclera: Conjunctivae normal.  Cardiovascular:     Rate and Rhythm: Normal rate and regular rhythm.  Pulmonary:     Effort: Pulmonary  effort is normal. No respiratory distress.     Breath sounds: No stridor.  Abdominal:     General: There is no distension.     Tenderness: There is no guarding.  Skin:    General: Skin is warm and dry.  Neurological:     Mental Status: She is alert and oriented to person, place, and time.     Cranial Nerves: No cranial nerve deficit.  Psychiatric:        Mood and Affect: Mood normal.     ED Results / Procedures / Treatments   Labs (all labs ordered are listed, but only abnormal results are displayed) Labs Reviewed  CBC WITH DIFFERENTIAL/PLATELET - Abnormal; Notable for the following components:      Result Value   WBC 12.8 (*)    Neutro Abs 11.1 (*)    All other components within normal limits  COMPREHENSIVE METABOLIC PANEL - Abnormal; Notable for the following components:   Glucose, Bld 126 (*)    All other components within normal limits  URINALYSIS, ROUTINE W REFLEX MICROSCOPIC - Abnormal; Notable for the following components:   APPearance CLOUDY (*)    Hgb urine dipstick LARGE (*)    Protein, ur 30 (*)    RBC / HPF >50 (*)    All other components within normal limits  I-STAT  BETA HCG BLOOD, ED ([facility], [facility], [facility] ONLY)    EKG None  Radiology No results found.  Procedures Procedures    Medications Ordered in ED Medications  ondansetron (ZOFRAN) injection 4 mg (4 mg Intravenous Given 01/25/22 1419)  morphine (PF) 4 MG/ML injection 4 mg (4 mg Intravenous Given 01/25/22 1424)  ketorolac (TORADOL) 30 MG/ML injection 15 mg (15 mg Intravenous Given 01/25/22 1422)  sodium chloride 0.9 % bolus 1,000 mL (1,000 mLs Intravenous New Bag/Given 01/25/22 1436)    ED Course/ Medical Decision Making/ A&P This patient with a Hx of kidney stones, pregnancy presents to the ED for concern of right-sided flank pain dating to the abdomen, this involves an extensive number of treatment options, and is a complaint that carries with it a high risk of complications and morbidity.    The  differential diagnosis includes nephrolithiasis, pyelonephritis, UTI, ovarian torsion less likely   Social Determinants of Health:  No limits  Additional history obtained:  Additional history and/or information obtained from female companion at bedside, notable for details of HPI   After the initial evaluation, orders, including: E, labs, urine, IV Toradol, morphine, fluids were initiated.   Patient placed on Cardiac and Pulse-Oximetry Monitors. The patient was maintained on a cardiac monitor.  The cardiac monitored showed an rhythm of 75 sinus normal The patient was also maintained on pulse oximetry. The readings were typically 100% room air normal   On repeat evaluation of the patient improved  Lab Tests:  I personally interpreted labs.  The pertinent results include: No pregnancy, mild leukocytosis, no urinary tract infection  Imaging Studies ordered:  T scan ordered, pending on signout Dispostion / Final MDM:  After consideration of the diagnostic results and the patient's response to treatment, Young female generally well-appearing, history of kidney stones, and prior pregnancy presents with right flank pain rating to her abdomen.  Lower abdomen non peritoneal, initial labs reassuring, some suspicion for kidney stone, versus other abdominal pathology, patient awaiting tests on signout.  Dr. Rubin Payor aware  Final Clinical Impression(s) / ED Diagnoses Final diagnoses:  Flank pain     Gerhard Munch, MD 01/25/22 1539

## 2022-01-25 NOTE — ED Provider Triage Note (Signed)
Emergency Medicine Provider Triage Evaluation Note  Anne [name] , a 26 y.o. female  was evaluated in triage.  Pt complains of right sided flank pain with nausea and vomiting since 0630 this AM. She reports this feels like her last kidney stone. Denies any dysuria or hematuria. She reports she has not had a menstraul cycle since having her 24 month old chld.   Review of Systems  Positive:  Negative:   Physical Exam  BP 127/71 (BP Location: Left Arm)   Pulse 73   Temp 98.4 F (36.9 C) (Oral)   Resp 18   SpO2 97%  Gen:   Awake, no distress   Resp:  Normal effort  MSK:   Moves extremities without difficulty  Other:    Medical Decision Making  Medically screening exam initiated at 11:53 AM.  Appropriate orders placed.  PATRISHA HAUSMANN was informed that the remainder of the evaluation will be completed by another provider, this initial triage assessment does not replace that evaluation, and the importance of remaining in the ED until their evaluation is complete.  CT renal + labs   Achille Rich, [place] 01/25/22 1154

## 2022-01-25 NOTE — ED Provider Notes (Signed)
  Physical Exam  BP 107/74   Pulse 85   Temp 98.6 F (37 C)   Resp 16   SpO2 100%   Physical Exam  Procedures  Procedures  ED Course / MDM    Medical Decision Making Risk Prescription drug management.  Flank pain. Stone rule out       Benjiman Core, MD 01/25/22 1537

## 2022-04-06 DIAGNOSIS — R051 Acute cough: Secondary | ICD-10-CM | POA: Diagnosis not present

## 2022-04-06 DIAGNOSIS — J019 Acute sinusitis, unspecified: Secondary | ICD-10-CM | POA: Diagnosis not present

## 2022-04-06 DIAGNOSIS — R0981 Nasal congestion: Secondary | ICD-10-CM | POA: Diagnosis not present

## 2022-04-06 DIAGNOSIS — K591 Functional diarrhea: Secondary | ICD-10-CM | POA: Diagnosis not present

## 2022-07-27 ENCOUNTER — Ambulatory Visit: Payer: BC Managed Care – PPO | Admitting: Neurology

## 2022-08-24 ENCOUNTER — Ambulatory Visit: Payer: BC Managed Care – PPO | Admitting: Neurology

## 2022-09-12 ENCOUNTER — Encounter: Payer: Self-pay | Admitting: Neurology

## 2022-09-12 ENCOUNTER — Ambulatory Visit (INDEPENDENT_AMBULATORY_CARE_PROVIDER_SITE_OTHER): Payer: BC Managed Care – PPO | Admitting: Neurology

## 2022-09-12 VITALS — BP 108/76 | HR 93 | Ht 61.0 in | Wt 166.0 lb

## 2022-09-12 DIAGNOSIS — G478 Other sleep disorders: Secondary | ICD-10-CM | POA: Diagnosis not present

## 2022-09-12 DIAGNOSIS — G4710 Hypersomnia, unspecified: Secondary | ICD-10-CM

## 2022-09-12 DIAGNOSIS — G4719 Other hypersomnia: Secondary | ICD-10-CM

## 2022-09-12 DIAGNOSIS — T733XXS Exhaustion due to excessive exertion, sequela: Secondary | ICD-10-CM

## 2022-09-12 DIAGNOSIS — R6889 Other general symptoms and signs: Secondary | ICD-10-CM

## 2022-09-12 NOTE — Patient Instructions (Signed)
Narcolepsy Narcolepsy is a disorder that causes people to fall asleep suddenly and without control (have sleep attacks) during the daytime. It is a lifelong disorder. Narcolepsy disrupts the sleep cycle at night, which then causes daytime sleepiness. What are the causes? The cause of narcolepsy is not fully understood, but it may be related to: Low levels of hypocretin, a chemical (neurotransmitter) in the brain that controls sleep and wake cycles. Hypocretin imbalance may be caused by: Abnormal genes that are passed from parent to child (inherited). An autoimmune disease in which the body's defense system (immune system) attacks the brain cells that make hypocretin. Infection, tumor, or injury in the area of the brain that controls sleep. Exposure to poisons (toxins), such as heavy metals, pesticides, and secondhand smoke. What are the signs or symptoms? Symptoms of this condition include: Excessive daytime sleepiness. This is the most common symptom and is usually the first symptom you will notice. This may affect your performance at work or school. Sleep attacks. You may fall asleep in the middle of an activity, especially low-energy activities like reading or watching TV. Feeling like you cannot think clearly and having trouble focusing or remembering things. You may also feel depressed. Sudden muscle weakness (cataplexy). When this occurs, your speech may become slurred, or your knees may buckle. Cataplexy is usually triggered by surprise, anger, fear, or laughter. Losing the ability to speak or move (sleep paralysis). This may occur just as you start to fall asleep or wake up. You will be aware of the paralysis. It usually lasts for just a few seconds or minutes. Seeing, hearing, tasting, smelling, or feeling things that are not real (hallucinations). Hallucinations may occur with sleep paralysis. They can happen when you are falling asleep, waking up, or dozing. Trouble staying asleep at  night (insomnia) and restless sleep. How is this diagnosed? This condition may be diagnosed based on: A physical exam to rule out any other problems that may be causing your symptoms. You may be asked to write down your sleeping patterns for several weeks in a sleep diary. This will help your health care provider make a diagnosis. Sleep studies that measure how well your REM sleep is regulated. These tests also measure your heart rate, breathing, movement, and brain waves. These tests include: An overnight sleep study (polysomnogram). A daytime sleep study that is done while you take several naps during the day (multiple sleep latency test, MSLT). This test measures how quickly you fall asleep and how quickly you enter REM sleep. Removal of spinal fluid to measure hypocretin levels. How is this treated? There is no cure for this condition, but treatment can help relieve symptoms. Treatment may include: Lifestyle and sleeping strategies to help you cope with the condition, such as: Exercising regularly. Maintaining a regular sleep schedule. Avoiding caffeine and large meals before bed. Medicines. These may include: Medicines that help keep you awake and alert (stimulants) to fight daytime sleepiness. Medicines that treat depression (antidepressants). These may be used to treat cataplexy. Sodium oxybate. This is a strong medicine to help you relax (sedative) that you may take at night. It can help control daytime sleepiness and cataplexy. Other treatments may include mental health counseling or joining a support group. Follow these instructions at home: Sleeping habits  Get about 8 hours of sleep every night. Go to sleep and get up at about the same time every day. Keep your bedroom dark, quiet, and comfortable. When you feel very tired, take short naps. Schedule  naps so that you take them at about the same time every day. Before bedtime: Avoid bright lights and screens. Relax. Try  activities like reading or taking a warm bath. Activity Get at least 20 minutes of exercise every day. This will help you sleep better at night and reduce daytime sleepiness. Avoid exercising within 3 hours of bedtime. Do not drive or use machinery if you are sleepy. If possible, take a nap before driving. Do not swim or go out on the water without a life jacket. Eating and drinking Do not drink alcohol or caffeinated beverages within 4-5 hours of bedtime. Do not eat a large meal before bedtime. Eat meals at about the same times every day. General instructions  Take over-the-counter and prescription medicines only as told by your health care provider. Keep a sleep diary as told by your health care provider. Tell your employer or teachers that you have narcolepsy. You may be able to adjust your schedule to include time for naps. Do not use any products that contain nicotine or tobacco. These products include cigarettes, chewing tobacco, and vaping devices, such as e-cigarettes. If you need help quitting, ask your health care provider. Where to find more information [employer] [facility] Neurological Disorders and Stroke: [email or web address] Contact a health care provider if: Your symptoms are not getting better. You have fast or irregular heartbeats (palpitations). You are having a hard time determining what is real and what is not (psychosis). Get help right away if: You hurt yourself during a sleep attack or an attack of cataplexy. You have chest pain. You have trouble breathing. These symptoms may be an emergency. Get help right away. Call 911. Do not wait to see if the symptoms will go away. Do not drive yourself to the hospital. Summary Narcolepsy is a disorder that causes people to fall asleep suddenly and without control during the daytime (sleep attacks). Narcolepsy is a lifelong disorder with no cure. Treatment can help relieve symptoms. Go to sleep and get up at about the same time  every day. Follow instructions about sleep and activities as told by your health care provider. Take over-the-counter and prescription medicines only as told by your health care provider. This information is not intended to replace advice given to you by your health care provider. Make sure you discuss any questions you have with your health care provider. Document Revised: 09/03/2021 Document Reviewed: 09/03/2021 Elsevier Patient Education  2023 [employer] Fatigue If you have fatigue, you feel tired all the time and have a lack of energy or a lack of motivation. Fatigue may make it difficult to start or complete tasks because of exhaustion. Occasional or mild fatigue is often a normal response to activity or life. However, long-term (chronic) or extreme fatigue may be a symptom of a medical condition such as: Depression. Not having enough red blood cells or hemoglobin in the blood (anemia). A problem with a small gland located in the lower front part of the neck (thyroid disorder). Rheumatologic conditions. These are problems related to the body's defense system (immune system). Infections, especially certain viral infections. Fatigue can also lead to negative health outcomes over time. Follow these instructions at home: Medicines Take over-the-counter and prescription medicines only as told by your health care provider. Take a multivitamin if told by your health care provider. Do not use herbal or dietary supplements unless they are approved by your health care provider. Eating and drinking  Avoid heavy meals in the evening. Eat a well-balanced  diet, which includes lean proteins, whole grains, plenty of fruits and vegetables, and low-fat dairy products. Avoid eating or drinking too many products with caffeine in them. Avoid alcohol. Drink enough fluid to keep your urine pale yellow. Activity  Exercise regularly, as told by your health care provider. Use or practice techniques to  help you relax, such as yoga, tai chi, meditation, or massage therapy. Lifestyle Change situations that cause you stress. Try to keep your work and personal schedules in balance. Do not use recreational or illegal drugs. General instructions Monitor your fatigue for any changes. Go to bed and get up at the same time every day. Avoid fatigue by pacing yourself during the day and getting enough sleep at night. Maintain a healthy weight. Contact a health care provider if: Your fatigue does not get better. You have a fever. You suddenly lose or gain weight. You have headaches. You have trouble falling asleep or sleeping through the night. You feel angry, guilty, anxious, or sad. You have swelling in your legs or another part of your body. Get help right away if: You feel confused, feel like you might faint, or faint. Your vision is blurry or you have a severe headache. You have severe pain in your abdomen, your back, or the area between your waist and hips (pelvis). You have chest pain, shortness of breath, or an irregular or fast heartbeat. You are unable to urinate, or you urinate less than normal. You have abnormal bleeding from the rectum, nose, lungs, nipples, or, if you are female, the vagina. You vomit blood. You have thoughts about hurting yourself or others. These symptoms may be an emergency. Get help right away. Call 911. Do not wait to see if the symptoms will go away. Do not drive yourself to the hospital. Get help right away if you feel like you may hurt yourself or others, or have thoughts about taking your own life. Go to your nearest emergency room or: Call 911. Call the National Suicide Prevention Lifeline at (405) 297-7853 or 988. This is open 24 hours a day. Text the Crisis Text Line at (803)363-3290. Summary If you have fatigue, you feel tired all the time and have a lack of energy or a lack of motivation. Fatigue may make it difficult to start or complete tasks because  of exhaustion. Long-term (chronic) or extreme fatigue may be a symptom of a medical condition. Exercise regularly, as told by your health care provider. Change situations that cause you stress. Try to keep your work and personal schedules in balance. This information is not intended to replace advice given to you by your health care provider. Make sure you discuss any questions you have with your health care provider. Document Revised: 02/22/2021 Document Reviewed: 02/22/2021 Elsevier Patient Education  2023 [employer] Hypersomnia Hypersomnia is a condition in which a person feels very tired during the day even though the person gets plenty of sleep at night. A person with this condition may take naps during the day and may find it very difficult to wake up from sleep. Hypersomnia may affect a person's ability to think, concentrate, drive, or remember things. What are the causes? The cause of this condition may not be known. Possible causes include: Taking certain medicines. Using drugs or alcohol. Sleep disorders, such as narcolepsy and sleep apnea. Injury to the head, brain, or spinal cord. Tumors. Certain medical conditions. These include: Depression. Diabetes. Gastroesophageal reflux disease (GERD). An underactive thyroid gland (hypothyroidism). What are the signs or  symptoms? The main symptoms of hypersomnia include: Feeling very tired throughout the day, regardless of how much sleep you got the night before. Having trouble waking up. Others may find it difficult to wake you up when you are sleeping. Sleeping for longer and longer periods at a time. Taking naps throughout the day. Other symptoms may include: Feeling restless, anxious, or annoyed. Lacking energy. Having trouble with: Remembering. Speaking. Thinking. Loss of appetite. Seeing, hearing, tasting, smelling, or feeling things that are not real (hallucinations). How is this diagnosed? This condition may be  diagnosed based on: Your symptoms and medical history. Your sleeping habits. Your health care provider may ask you to write down your sleeping habits in a daily sleep log, along with any symptoms you have. A series of tests that are done while you sleep (sleep study or polysomnogram). A test that measures how quickly you can fall asleep during the day (daytime nap study or multiple sleep latency test). How is this treated? This condition may be treated by: Following a regular sleep routine. Making lifestyle changes, such as changing your eating habits, getting regular exercise, and avoiding alcohol or caffeinated beverages. Taking medicines to make you more alert (stimulants) during the day. Treating any underlying medical causes of hypersomnia. Follow these instructions at home: Sleep habits Stick to a routine that includes going to bed and waking up at the same times every day and night. Practice a relaxing bedtime routine. This may include reading, meditation, deep breathing, or taking a warm bath before going to sleep. Exercise regularly as told by your health care provider. However, avoid exercising in the hours right before bedtime. Keep your sleep environment at a cooler temperature, darkened, and quiet. Sleep with pillows and a mattress that are comfortable and supportive. Schedule short 20-minute naps for when you feel sleepiest during the day. Talk with your employer or teachers about your hypersomnia. If possible, adjust your schedule so that: You have a regular daytime work schedule. You can take a scheduled nap during the day. You do not have to work or be active at night. Do not eat a heavy meal for a few hours before bedtime. Eat your meals at about the same times every day. Safety  Do not drive or use machinery if you are sleepy. Ask your health care provider if it is safe for you to drive. Wear a life jacket when swimming or spending time near water. General  instructions  Take over-the-counter and prescription medicines only as told by your health care provider. This includes supplements. Avoid drinking alcohol or caffeinated beverages. Keep a sleep log that will help your health care provider manage your condition. This may include information about: What time you go to bed each night. How often you wake up at night. How many hours you sleep at night. How often and for how long you nap during the day. Any observations from others, such as leg movements during sleep, sleep walking, or snoring. Keep all follow-up visits. This is important. Contact a health care provider if: You have new symptoms. Your symptoms get worse. Get help right away if: You have thoughts about hurting yourself or someone else. Get help right away if you feel like you may hurt yourself or others, or have thoughts about taking your own life. Go to your nearest emergency room or: Call 911. Call the National Suicide Prevention Lifeline at 754-285-6217 or 988. This is open 24 hours a day. Text the Crisis Text Line at 3432425973. Summary  Hypersomnia refers to a condition in which you feel very tired during the day even though you get plenty of sleep at night. A person with this condition may take naps during the day and may find it very difficult to wake up from sleep. Hypersomnia may affect a person's ability to think, concentrate, drive, or remember things. Treatment may include a regular sleep routine and making some lifestyle changes. This information is not intended to replace advice given to you by your health care provider. Make sure you discuss any questions you have with your health care provider. Document Revised: 04/12/2021 Document Reviewed: 04/12/2021 Elsevier Patient Education  2023 [employer].
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
## 2022-11-01 ENCOUNTER — Telehealth: Payer: Self-pay | Admitting: Neurology

## 2022-11-01 NOTE — Telephone Encounter (Signed)
Sent mychart message

## 2023-03-20 ENCOUNTER — Encounter: Payer: Self-pay | Admitting: Neurology

## 2023-03-28 NOTE — Telephone Encounter (Signed)
NPSG MCD [place]: [phone] (exp. 03/20/23 to 06/14/23)  MSLT MCD [place]: [phone] (exp. 03/20/23 to 06/14/23)

## 2023-05-03 ENCOUNTER — Emergency Department (HOSPITAL_COMMUNITY): Payer: MEDICAID

## 2023-05-03 ENCOUNTER — Emergency Department (HOSPITAL_BASED_OUTPATIENT_CLINIC_OR_DEPARTMENT_OTHER)
Admission: EM | Admit: 2023-05-03 | Discharge: 2023-05-03 | Disposition: A | Discharge status: Home / Self Care | Payer: MEDICAID | Attending: Emergency Medicine | Admitting: Emergency Medicine

## 2023-05-03 ENCOUNTER — Emergency Department (HOSPITAL_BASED_OUTPATIENT_CLINIC_OR_DEPARTMENT_OTHER): Payer: MEDICAID

## 2023-05-03 ENCOUNTER — Encounter (HOSPITAL_BASED_OUTPATIENT_CLINIC_OR_DEPARTMENT_OTHER): Payer: Self-pay

## 2023-05-03 DIAGNOSIS — I7774 Dissection of vertebral artery: Secondary | ICD-10-CM | POA: Diagnosis not present

## 2023-05-03 DIAGNOSIS — Z87891 Personal history of nicotine dependence: Secondary | ICD-10-CM | POA: Diagnosis not present

## 2023-05-03 DIAGNOSIS — J45909 Unspecified asthma, uncomplicated: Secondary | ICD-10-CM | POA: Insufficient documentation

## 2023-05-03 DIAGNOSIS — R519 Headache, unspecified: Secondary | ICD-10-CM | POA: Insufficient documentation

## 2023-05-03 LAB — CBC WITH DIFFERENTIAL/PLATELET
Abs Immature Granulocytes: 0.02 10*3/uL (ref 0.00–0.07)
Basophils Absolute: 0.1 10*3/uL (ref 0.0–0.1)
Basophils Relative: 1 %
Eosinophils Absolute: 0.1 10*3/uL (ref 0.0–0.5)
Eosinophils Relative: 1 %
HCT: 37.9 % (ref 36.0–46.0)
Hemoglobin: 12.2 g/dL (ref 12.0–15.0)
Immature Granulocytes: 0 %
Lymphocytes Relative: 47 %
Lymphs Abs: 3.6 10*3/uL (ref 0.7–4.0)
MCH: 29.5 pg (ref 26.0–34.0)
MCHC: 32.2 g/dL (ref 30.0–36.0)
MCV: 91.8 fL (ref 80.0–100.0)
Monocytes Absolute: 0.6 10*3/uL (ref 0.1–1.0)
Monocytes Relative: 7 %
Neutro Abs: 3.5 10*3/uL (ref 1.7–7.7)
Neutrophils Relative %: 44 %
Platelets: 342 10*3/uL (ref 150–400)
RBC: 4.13 MIL/uL (ref 3.87–5.11)
RDW: 12.6 % (ref 11.5–15.5)
WBC: 7.8 10*3/uL (ref 4.0–10.5)
nRBC: 0 % (ref 0.0–0.2)

## 2023-05-03 LAB — BASIC METABOLIC PANEL
Anion gap: 4 — ABNORMAL LOW (ref 5–15)
BUN: 9 mg/dL (ref 6–20)
CO2: 27 mmol/L (ref 22–32)
Calcium: 8.8 mg/dL — ABNORMAL LOW (ref 8.9–10.3)
Chloride: 105 mmol/L (ref 98–111)
Creatinine, Ser: 0.57 mg/dL (ref 0.44–1.00)
GFR, Estimated: >60 mL/min (ref >60)
Glucose, Bld: 91 mg/dL (ref 70–99)
Potassium: 3.4 mmol/L — ABNORMAL LOW (ref 3.5–5.1)
Sodium: 136 mmol/L (ref 135–145)

## 2023-05-03 LAB — MAGNESIUM: Magnesium: 2 mg/dL (ref 1.7–2.4)

## 2023-05-03 LAB — HCG, QUANTITATIVE, PREGNANCY: hCG, Beta Chain, Quant, S: 1 m[IU]/mL (ref <5)

## 2023-05-03 MED ORDER — CLOPIDOGREL BISULFATE 300 MG PO TABS
300.0000 mg | ORAL_TABLET | Freq: Once | ORAL | Status: AC
  Administered 2023-05-03: 300 mg via ORAL
  Filled 2023-05-03: qty 1

## 2023-05-03 MED ORDER — OXYCODONE HCL 5 MG PO TABS: 5.0000 mg | ORAL_TABLET | Freq: Four times a day (QID) | ORAL | 0 refills | Status: DC | PRN

## 2023-05-03 MED ORDER — IOHEXOL 350 MG/ML SOLN
100.0000 mL | Freq: Once | INTRAVENOUS | Status: AC | PRN
  Administered 2023-05-03: 75 mL via INTRAVENOUS

## 2023-05-03 MED ORDER — HYDROMORPHONE HCL 1 MG/ML IJ SOLN
0.5000 mg | Freq: Once | INTRAMUSCULAR | Status: AC
  Administered 2023-05-03: 0.5 mg via INTRAVENOUS
  Filled 2023-05-03: qty 1

## 2023-05-03 MED ORDER — METHOCARBAMOL 500 MG PO TABS: 1000.0000 mg | ORAL_TABLET | Freq: Three times a day (TID) | ORAL | 0 refills | Status: DC | PRN

## 2023-05-03 MED ORDER — CLOPIDOGREL BISULFATE 75 MG PO TABS: 75.0000 mg | ORAL_TABLET | Freq: Every day | ORAL | 0 refills | Status: DC

## 2023-05-03 MED ORDER — MORPHINE SULFATE (PF) 4 MG/ML IV SOLN
4.0000 mg | Freq: Once | INTRAVENOUS | Status: AC
  Administered 2023-05-03: 4 mg via INTRAVENOUS
  Filled 2023-05-03: qty 1

## 2023-05-03 MED ORDER — SODIUM CHLORIDE 0.9 % IV BOLUS
1000.0000 mL | Freq: Once | INTRAVENOUS | Status: AC
  Administered 2023-05-03: 1000 mL via INTRAVENOUS

## 2023-05-03 MED ORDER — DIPHENHYDRAMINE HCL 50 MG/ML IJ SOLN
25.0000 mg | Freq: Once | INTRAMUSCULAR | Status: AC
  Administered 2023-05-03: 25 mg via INTRAVENOUS
  Filled 2023-05-03: qty 1

## 2023-05-03 MED ORDER — POTASSIUM CHLORIDE CRYS ER 20 MEQ PO TBCR
40.0000 meq | EXTENDED_RELEASE_TABLET | Freq: Once | ORAL | Status: AC
  Administered 2023-05-03: 40 meq via ORAL
  Filled 2023-05-03: qty 2

## 2023-05-03 MED ORDER — ASPIRIN 81 MG PO CHEW
324.0000 mg | CHEWABLE_TABLET | Freq: Once | ORAL | Status: AC
  Administered 2023-05-03: 324 mg via ORAL
  Filled 2023-05-03: qty 4

## 2023-05-03 MED ORDER — ASPIRIN 81 MG PO CHEW: 81.0000 mg | CHEWABLE_TABLET | Freq: Every day | ORAL | 0 refills | Status: DC

## 2023-05-03 MED ORDER — METOCLOPRAMIDE HCL 5 MG/ML IJ SOLN
5.0000 mg | Freq: Once | INTRAMUSCULAR | Status: AC
  Administered 2023-05-03: 5 mg via INTRAVENOUS
  Filled 2023-05-03: qty 2

## 2023-05-03 NOTE — ED Notes (Signed)
Report to Creline and Solmon Ice charge at [facility]

## 2023-05-03 NOTE — Discharge Instructions (Signed)
Please read and follow all provided instructions.  Your diagnoses today include:  1. Dissection, vertebral artery (HCC)     Tests performed today include: MRI of the brain did not show any strokes or other abnormalities with the brain CT scan of the brain and neck, including the blood vessels, suggested a vertebral artery dissection Vital signs. See below for your results today.   Medications prescribed:  Plavix and aspirin: Medications to help prevent clots, please take daily as prescribed  Take any prescribed medications only as directed.  Home care instructions:  Follow any educational materials contained in this packet.  BE VERY CAREFUL not to take multiple medicines containing Tylenol (also called acetaminophen). Doing so can lead to an overdose which can damage your liver and cause liver failure and possibly death.   Follow-up instructions: Please follow-up with your neurologist for further evaluation and management  Return instructions:  Please return to the Emergency Department if you experience worsening symptoms.  Return if you have weakness in your arms or legs, slurred speech, trouble walking or talking, confusion, or trouble with your balance.  Please return if you have any other emergent concerns.  Additional Information:  Your vital signs today were: BP 102/75 (BP Location: Left Arm)   Pulse 94   Temp 98.4 F (36.9 C) (Oral)   Resp 16   Ht 5\' 1"  (1.549 m)   Wt 73.9 kg   LMP 04/10/2023 (Exact Date)   SpO2 99%   BMI 30.80 kg/m  If your blood pressure (BP) was elevated above 135/85 this visit, please have this repeated by your doctor within one month. --------------

## 2023-05-03 NOTE — ED Notes (Signed)
Pt has used Bed pan x 2 500cc  each

## 2023-05-03 NOTE — ED Notes (Signed)
PT arrived from [facility] a and o x 4. Peripheral vision loss remains.

## 2023-05-03 NOTE — ED Provider Notes (Signed)
[place] [facility] Provider Note  CSN: [id number] Arrival date & time: 05/03/23 1253  Chief Complaint(s) Loss of Vision  HPI Anne [name] is a 27 y.o. female with past medical history as below, significant for migraine headaches, depression, ovarian cyst, hypersomnia POTS who presents to the ED with complaint of vision changes.  She reports around 02-1029 am she was having some neck pain, she tried to adjust her neck/crack her neck, massage her neck and she had worsening headache, felt like she was having some left-sided peripheral field vision abnormalities, thought she had a pinched nerve.  Similar symptoms in the past associate with headache. Headache similar to prior migraine, did not have her medication since she was at school. She feels like her peripheral vision on the far left looks like "tv static." No numbness/weakness to extremities, no hearing changes, no speech or swallowing abnormalities.    Similar symptoms in the past a/w migraine headache; did resolve previously with headache medication per the pt.   Past Medical History Past Medical History:  Diagnosis Date   Anemia    Anxiety    Anxiety and depression    Asthma    Asthma    Depression    doing good   History of cystitis    Infection    UTI   Orthostatic hypotension    Ovarian cyst    Ovarian cyst    "ruptured in 3rd grade"   Patient Active Problem List   Diagnosis Date Noted   Hypersomnia 09/12/2022   Vivid dream 09/12/2022   Non-restorative sleep 09/12/2022   Excessive daytime sleepiness 09/12/2022   Postural orthostatic tachycardia syndrome 06/29/2021   Pregnancy 06/26/2018   Pregnant 07/02/2017   Micrognathia of fetus affecting management of mother, antepartum 03/02/2017   Hirsutism 02/20/2015   Irregular periods 02/20/2015   Complicated migraine 02/20/2015   Mood disorder (HCC) 08/26/2014   Home Medication(s) Prior to Admission medications   Medication  Sig Start Date End Date Taking? Authorizing Provider  predniSONE (STERAPRED UNI-PAK 21 TAB) 5 MG (21) TBPK tablet Take 5 mg by mouth daily.   Yes [provider]  albuterol (PROVENTIL HFA;VENTOLIN HFA) 108 (90 Base) MCG/ACT inhaler Inhale 1-2 puffs into the lungs every 6 (six) hours as needed for wheezing or shortness of breath.     [provider]                                                                                                                                    Past Surgical History Past Surgical History:  Procedure Laterality Date   NO PAST SURGERIES     Family History Family History  Problem Relation Age of Onset   Depression Mother    Anxiety disorder Mother    Headache Father    Depression Maternal Grandmother    Anxiety disorder Maternal Grandmother    Thyroid disease Maternal Grandmother  Stroke Maternal Grandmother    Hypertension Maternal Grandmother    Depression Maternal Grandfather    Anxiety disorder Maternal Grandfather    Stroke Maternal Grandfather    Hypertension Maternal Grandfather    Heart disease Maternal Grandfather    Melanoma Paternal Grandmother    Hypertension Paternal Grandfather    Heart disease Paternal Grandfather     Social History Social History   Tobacco Use   Smoking status: Former   Smokeless tobacco: Never   Tobacco comments:    quit in Jan'21  Vaping Use   Vaping status: Former  Substance Use Topics   Alcohol use: Yes    Comment: Social   Drug use: No   Allergies Patient has no known allergies.  Review of Systems Review of Systems  Constitutional:  Negative for chills and fever.  HENT:  Negative for congestion.   Eyes:  Positive for visual disturbance.  Respiratory:  Negative for chest tightness and shortness of breath.   Cardiovascular:  Negative for chest pain and palpitations.  Gastrointestinal:  Negative for abdominal pain.  Genitourinary:  Negative for dysuria.  Musculoskeletal:   Positive for neck pain. Negative for arthralgias and back pain.  Skin:  Negative for rash and wound.  Neurological:  Positive for headaches. Negative for syncope, weakness and numbness.  All other systems reviewed and are negative.   Physical Exam Vital Signs  I have reviewed the triage vital signs BP 124/80 (BP Location: Left Arm)   Pulse 90   Temp 98 F (36.7 C)   Resp 18   Ht 5\' 1"  (1.549 m)   Wt 73.9 kg   LMP 04/10/2023 (Exact Date)   SpO2 100%   BMI 30.80 kg/m  Physical Exam Vitals and nursing note reviewed.  Constitutional:      General: She is not in acute distress.    Appearance: Normal appearance.  HENT:     Head: Normocephalic and atraumatic. No raccoon eyes, Battle's sign, right periorbital erythema or left periorbital erythema.     Right Ear: External ear normal.     Left Ear: External ear normal.     Nose: Nose normal.     Mouth/Throat:     Mouth: Mucous membranes are moist.  Eyes:     General: No visual field deficit or scleral icterus.       Right eye: No discharge.        Left eye: No discharge.     Extraocular Movements: Extraocular movements intact.     Pupils: Pupils are equal, round, and reactive to light.  Neck:     Vascular: No carotid bruit.     Trachea: Trachea and phonation normal.     Comments: Some pain when turning to the left   Cardiovascular:     Rate and Rhythm: Normal rate and regular rhythm.     Pulses: Normal pulses.     Heart sounds: Normal heart sounds.  Pulmonary:     Effort: Pulmonary effort is normal. No respiratory distress.     Breath sounds: Normal breath sounds. No stridor.  Abdominal:     General: Abdomen is flat. There is no distension.     Palpations: Abdomen is soft.     Tenderness: There is no abdominal tenderness.  Musculoskeletal:     Cervical back: No rigidity. Pain with movement present.     Right lower leg: No edema.     Left lower leg: No edema.  Skin:    General: Skin is warm  and dry.     Capillary  Refill: Capillary refill takes less than 2 seconds.  Neurological:     Mental Status: She is alert and oriented to person, place, and time.     GCS: GCS eye subscore is 4. GCS verbal subscore is 5. GCS motor subscore is 6.     Cranial Nerves: Cranial nerves 2-12 are intact. No dysarthria.     Sensory: Sensation is intact.     Motor: Motor function is intact. No weakness, tremor or pronator drift.     Coordination: Coordination is intact. Finger-Nose-Finger Test normal.     Gait: Gait is intact.     Comments: Strength 5/5 to BLUE/BLLE, equal and symmetric   No visual field cuts on exam  Psychiatric:        Mood and Affect: Mood normal.        Behavior: Behavior normal. Behavior is cooperative.     ED Results and Treatments Labs (all labs ordered are listed, but only abnormal results are displayed) Labs Reviewed  BASIC METABOLIC PANEL - Abnormal; Notable for the following components:      Result Value   Potassium 3.4 (*)    Calcium 8.8 (*)    Anion gap 4 (*)    All other components within normal limits  HCG, QUANTITATIVE, PREGNANCY  CBC WITH DIFFERENTIAL/PLATELET  MAGNESIUM                                                                                                                          Radiology No results found.  Pertinent labs & imaging results that were available during my care of the patient were reviewed by me and considered in my medical decision making (see MDM for details).  Medications Ordered in ED Medications  potassium chloride SA (KLOR-CON M) CR tablet 40 mEq (has no administration in time range)  diphenhydrAMINE (BENADRYL) injection 25 mg (25 mg Intravenous Given 05/03/23 1403)  sodium chloride 0.9 % bolus 1,000 mL (0 mLs Intravenous Stopped 05/03/23 1513)  metoCLOPramide (REGLAN) injection 5 mg (5 mg Intravenous Given 05/03/23 1403)  iohexol (OMNIPAQUE) 350 MG/ML injection 100 mL (75 mLs Intravenous Contrast Given 05/03/23 1539)  morphine (PF) 4 MG/ML  injection 4 mg (4 mg Intravenous Given 05/03/23 1519)  Procedures Procedures  (including critical care time)  Medical Decision Making / ED Course    Medical Decision Making:    CEIL BLAUER is a 27 y.o. female ith past medical history as below, significant for migraine headaches, depression, ovarian cyst, hypersomnia POTS who presents to the ED with complaint of vision changes.. The complaint involves an extensive differential diagnosis and also carries with it a high risk of complications and morbidity.  Serious etiology was considered. Ddx includes but is not limited to: Differential diagnosis includes but is not exclusive to subarachnoid hemorrhage, meningitis, encephalitis, previous head trauma, cavernous venous thrombosis, muscle tension headache, glaucoma, temporal arteritis, migraine or migraine equivalent, etc.   Complete initial physical exam performed, notably the patient was in NAD.    Reviewed and confirmed nursing documentation for past medical history, family history, social history.  Vital signs reviewed.    Clinical Course as of 05/03/23 1549  Wed May 03, 2023  1354 Spoke with neurology, agreeable to hold off on culture stroke at this time.  Will get CT angio.  Will provide migraine cocktail and collect screening labs [SG]  1516 Received sign out for episode of visual loss, headaches. Had prior episode with MRI and outpatient follow up thought to be migraine related. Pending imaging. If improved anticipate discharge. Was discussed with neurology Dr. Tempie Hoist.  [WS]  1548 Potassium(!): 3.4 Will replete [SG]    Clinical Course User Index [SG] Sloan Leiter, DO [WS] Lonell Grandchild, MD    Brief summary: 27 yo female w/ hx as above here w/ headache/neck pain and vision changes   Patient with headache, with left-sided peripheral vision  abnormality.  No visual field cuts on my exam.  Neuroexam is nonfocal. Will d/w neuro  Labs reviewed, these are stable, will give migraine cocktail, get CT imaging per neurology recommendation.  Handoff to incoming team pending imaging and recheck.              Additional history obtained: -Additional history obtained from family -External records from outside source obtained and reviewed including: Chart review including previous notes, labs, imaging, consultation notes including  Prior neurology office note, prior imaging, home medications, primary care recommendation   Lab Tests: -I ordered, reviewed, and interpreted labs.   The pertinent results include:   Labs Reviewed  BASIC METABOLIC PANEL - Abnormal; Notable for the following components:      Result Value   Potassium 3.4 (*)    Calcium 8.8 (*)    Anion gap 4 (*)    All other components within normal limits  HCG, QUANTITATIVE, PREGNANCY  CBC WITH DIFFERENTIAL/PLATELET  MAGNESIUM    Notable for labs stable  EKG   EKG Interpretation Date/Time:    Ventricular Rate:    PR Interval:    QRS Duration:    QT Interval:    QTC Calculation:   R Axis:      Text Interpretation:           Imaging Studies ordered: I ordered imaging studies including CTA head neck/ CT c/s >> PENDING  Medicines ordered and prescription drug management: Meds ordered this encounter  Medications   diphenhydrAMINE (BENADRYL) injection 25 mg   sodium chloride 0.9 % bolus 1,000 mL   metoCLOPramide (REGLAN) injection 5 mg   iohexol (OMNIPAQUE) 350 MG/ML injection 100 mL   morphine (PF) 4 MG/ML injection 4 mg   potassium chloride SA (KLOR-CON M) CR tablet 40 mEq    -I have reviewed the  patients home medicines and have made adjustments as needed   Consultations Obtained: I requested consultation with the neuro Dr Iver Nestle,  and discussed lab and imaging findings as well as pertinent plan - they recommend: as above   Cardiac  Monitoring: The patient was maintained on a cardiac monitor.  I personally viewed and interpreted the cardiac monitored which showed an underlying rhythm of: NSR Continuous pulse oximetry interpreted by myself, 100% on RA.    Social Determinants of Health:  Diagnosis or treatment significantly limited by social determinants of health: former smoker   Reevaluation: After the interventions noted above, I reevaluated the patient and found that they have improved  Co morbidities that complicate the patient evaluation  Past Medical History:  Diagnosis Date   Anemia    Anxiety    Anxiety and depression    Asthma    Asthma    Depression    doing good   History of cystitis    Infection    UTI   Orthostatic hypotension    Ovarian cyst    Ovarian cyst    "ruptured in 3rd grade"      Dispostion: Disposition decision including need for hospitalization was considered, and patient disposition pending at time of sign out.    Final Clinical Impression(s) / ED Diagnoses Final diagnoses:  Nonintractable headache, unspecified chronicity pattern, unspecified headache type        Sloan Leiter, DO 05/03/23 1550

## 2023-05-03 NOTE — ED Triage Notes (Signed)
Pt reports to ED with complaints of loss of her peripheral vison in the left eye. States that this has happened before and it was pinched nerve.

## 2023-05-03 NOTE — ED Notes (Addendum)
Pt up to go to bathroom ambulatory , got back in bed and then tells CT tech that her left side of face went numb along with left hand and tongue, states some of it got better in hand and tongue but still has some  in face dr aware,

## 2023-05-03 NOTE — ED Provider Notes (Signed)
   Procedures  .Critical Care  Performed by: Lonell Grandchild, MD Authorized by: Lonell Grandchild, MD   Critical care provider statement:    Critical care time (minutes):  30   Critical care was necessary to treat or prevent imminent or life-threatening deterioration of the following conditions:  CNS failure or compromise   Critical care was time spent personally by me on the following activities:  Development of treatment plan with patient or surrogate, discussions with consultants, evaluation of patient's response to treatment, examination of patient, ordering and review of laboratory studies, ordering and review of radiographic studies, ordering and performing treatments and interventions, pulse oximetry, re-evaluation of patient's condition and review of old charts   Care discussed with: admitting provider     ED Course / MDM   Clinical Course as of 05/03/23 1759  Wed May 03, 2023  1354 Spoke with neurology, agreeable to hold off on culture stroke at this time.  Will get CT angio.  Will provide migraine cocktail and collect screening labs [SG]  1516 Received sign out for episode of visual loss, headaches. Had prior episode with MRI and outpatient follow up thought to be migraine related. Pending imaging. If improved anticipate discharge. Was discussed with neurology Dr. Tempie Hoist.  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  Discussed with Dr. Iver Nestle, she recommends that we transfer the patient to [facility] for MRI.  She is not sure whether or not the vertebral artery finding is an acute issue or not, so recommends obtaining MRI first and going from there.  Recommends against any antiplatelet agents or anything for now.  The patient reports that her neurologic symptoms have resolved other than headache and neck pain.  Discussed with Dr. Andria Meuse at [facility] who accepts the patient for transfer. [WS]     Clinical Course User Index [SG] Sloan Leiter, DO [WS] Lonell Grandchild, MD   Medical Decision Making Amount and/or Complexity of Data Reviewed Labs: ordered. Decision-making details documented in ED Course. Radiology: ordered.  Risk Prescription drug management.          Lonell Grandchild, MD 05/03/23 1800

## 2023-05-03 NOTE — Plan of Care (Addendum)
Discussed patient with Dr. Wallace Cullens this is a 27 year old with history of complex migraines (right sided vision loss, right arm weakness in the past).  She is presenting with her typical headache, but this time has some subtle left peripheral vision loss that she does not feel is disabling.  She is requesting headache treatment only, Dr. Wallace Cullens is requesting my opinion on any other workup.  As she was reporting attempting to crack her neck today, I do think a CTA head and neck to exclude dissection is appropriate.  If her symptoms resolved with migraine cocktail, continued outpatient follow-up is appropriate.  However if her symptoms are worsening or new questions or concerns arise she may need MRI.  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  Pontine hypodensity is questionable and may be artifactual as this area is highly prone to artifact on CT scan and does not fit with the symptoms as reported by the patient.  On further discussion with the patient by Dr. Suezanne Jacquet she did also have some transient left-sided tingling.  There are no data in acute setting on superiority of anticoagulation versus dual antiplatelet therapy.  I do think because of the CTA findings MRI brain should be obtained to clarify if there is any acute intracranial process  If MRI brain is positive, please discussed with Dr. Derry Lory who will be on at [facility]; I have notified him of this patient  If MRI brain is negative, can be discharged with dual antiplatelet therapy for dissection (load with 300 mg of Plavix here, 325 mg aspirin, followed by 81 mg aspirin daily and 75 mg Plavix daily to be continued for at least 3 months with outpatient neurology follow-up for repeat imaging in that timeframe).  Patient should be given strict return precautions for any stroke symptoms; review BE FAST mnemonic and remind patient 911 should be activated for stroke symptoms to ensure  safe transport to a facility with appropriate level of care available   Brooke Dare MD-PhD [facility] [phone] Available 7 AM to 7 PM, outside these hours please contact Neurologist on call listed on AMION   7 minutes of care spent majority in direct discussion with Dr. Wallace Cullens, who discussed call to neurologist with patient  An additional 10 min of care spent

## 2023-05-03 NOTE — ED Provider Notes (Signed)
7:25 PM patient arrived from med [facility] for MRI.  Patient developed neck pain this morning with acute onset of left sided lateral visual field changes.  Symptoms have been stable.  No extremity symptoms or weakness.  Neurology was contacted by previous team after having CTA with question of of vertebral artery dissection and pontine stroke.  Recommended MRI for further evaluation.  Patient stable at current time, awaiting MRI.  She continues to have pain but states that nothing she was given previous helped her pain in her neck.  Her headache is improved.  MRI has been ordered.  Per Dr. Rollene Fare note:  If MRI brain is positive, please discussed with Dr. Derry Lory who will be on at [facility]; I have notified him of this patient   If MRI brain is negative, can be discharged with dual antiplatelet therapy for dissection (load with 300 mg of Plavix here, 325 mg aspirin, followed by 81 mg aspirin daily and 75 mg Plavix daily to be continued for at least 3 months with outpatient neurology follow-up for repeat imaging in that timeframe).  Patient should be given strict return precautions for any stroke symptoms; review BE FAST mnemonic and remind patient 911 should be activated for stroke symptoms to ensure safe transport to a facility with appropriate level of care available   10:45 PM Reassessment performed. Patient appears comfortable.  Patient received a dose of IV Dilaudid for headache and neck pain after MRI.  Imaging personally visualized and interpreted including: MRI, agree no acute findings.  Reviewed pertinent lab work and imaging with patient at bedside. Questions answered.   Most current vital signs reviewed and are as follows: BP 102/75 (BP Location: Left Arm)   Pulse 94   Temp 98.4 F (36.9 C) (Oral)   Resp 16   Ht 5\' 1"  (1.549 m)   Wt 73.9 kg   LMP 04/10/2023 (Exact Date)   SpO2 99%   BMI 30.80 kg/m   Plan: Discharge to home.  Patient has received p.o.  Plavix and aspirin per neurology recommendations.  She will be discharged home as her MRI is negative.  Prescriptions written for: Plavix, aspirin, oxycodone # 8 tablets, Robaxin  Patient counseled on use of narcotic pain medications. Counseled not to combine these medications with others containing tylenol. Urged not to drink alcohol, drive, or perform any other activities that requires focus while taking these medications. The patient verbalizes understanding and agrees with the plan.  Other home care instructions discussed: Recommended no driving for several days, no manipulations of the neck.  ED return instructions discussed: We discussed stroke signs and symptoms and need to call 911/return if these occur. Patient counseled to return if they have weakness in their arms or legs, slurred speech, trouble walking or talking, confusion, trouble with their balance, or if they have any other concerns. Patient verbalizes understanding and agrees with plan.   Follow-up instructions discussed: Patient encouraged to follow-up with their neurologist.  I placed an ambulatory referral through epic.     Renne Crigler, PA-C 05/03/23 2249    Tegeler, Canary Brim, MD 05/03/23 [phone]

## 2023-05-05 ENCOUNTER — Encounter: Payer: Self-pay | Admitting: Neurology

## 2023-05-24 ENCOUNTER — Telehealth: Payer: Self-pay | Admitting: Neurology

## 2023-05-24 ENCOUNTER — Ambulatory Visit: Payer: Medicaid Other | Admitting: Neurology

## 2023-05-24 ENCOUNTER — Encounter: Payer: Self-pay | Admitting: Neurology

## 2023-05-24 VITALS — BP 136/78 | HR 90 | Ht 61.0 in | Wt 164.0 lb

## 2023-05-24 DIAGNOSIS — H53452 Other localized visual field defect, left eye: Secondary | ICD-10-CM

## 2023-05-24 DIAGNOSIS — I7774 Dissection of vertebral artery: Secondary | ICD-10-CM | POA: Diagnosis not present

## 2023-05-24 DIAGNOSIS — G478 Other sleep disorders: Secondary | ICD-10-CM

## 2023-05-24 MED ORDER — VERAPAMIL HCL ER 180 MG PO TBCR: 180.0000 mg | EXTENDED_RELEASE_TABLET | Freq: Every day | ORAL | 3 refills | Status: DC

## 2023-05-24 NOTE — Patient Instructions (Signed)
 Vertebral Artery Dissection  Vertebral artery dissection is a tear in a vertebral artery. The vertebral arteries are major blood vessels at the base of the neck. They carry blood from the heart to the brain. When an artery tears, blood collects inside the layers of the artery wall. This can cause a blood clot. This condition increases the risk for stroke if it is not diagnosed and treated right away. It is a common cause of stroke in people who are 5-106 years old. What are the causes? This condition may be caused by: A neck injury due to sudden or too much neck movement. This is called a traumatic dissection. Having weak blood vessel walls. The walls may tear even when no injury occurs (spontaneous dissection). Chiropractic adjustments. A minimal form of blunt trauma. In many cases, the cause of this condition is not known. What increases the risk? The following factors may make you more likely to develop this condition: High blood pressure (hypertension). Migraines. Inherited diseases that affect the strength or shape of the blood vessels. Tobacco use. What are the signs or symptoms? Symptoms usually appear within days of an injury, but sometimes they may not appear for weeks or years. Common symptoms of this condition include: Headache, with a sharp, stabbing pain in the head, neck, eye, or face. Dizziness. Vertigo. This is a feeling that you or things around you are moving when they are not. Double vision. Other symptoms include: Hoarse voice. Hearing loss. Hiccups. Loss of taste. Difficulty speaking. Loss of balance and coordination. Difficulty swallowing. Ear pain. Nausea and vomiting. Loss of feeling in the torso, legs, or arms. How is this diagnosed? This condition is diagnosed based on tests, such as: CT angiogram. This test uses a computer to take X-rays of your vertebral arteries. A dye may be injected into your blood to show the inside of your blood vessels more  clearly. MRI angiogram. This is used to check the health of the blood vessels. Cerebral angiogram. This test takes X-ray images of the blood vessels in the neck and brain. A dye is used to show the inside of the blood vessels. Doppler ultrasound. This test uses sound waves to create images of the vertebral arteries. It shows how well blood flows through your arteries. How is this treated? Treatment depends on the cause of your vertebral artery dissection and on your overall health. The goal of treatment is to prevent a stroke. If you are having a stroke, it is important to get treatment quickly. Treatment may include: Blood thinners. This medicine helps to prevent blood clots. This may be given first through an IV, and then as pills for 3-6 months. Antiplatelet medicines. This medicine keeps the platelets from sticking together, which prevents blood clots from forming. Procedures to widen a narrow blood vessel (angioplasty) or to place a mesh tube (stent) inside the blood vessel to keep it open. Surgery to repair the area. This is rarely needed. Follow these instructions at home: Medicines Take over-the-counter and prescription medicines only as told by your health care provider. If you are taking blood thinners: Talk with your health care provider before you take any medicines that contain aspirin  or NSAIDs, such as ibuprofen . These medicines increase your risk for dangerous bleeding. Take your medicine exactly as told, at the same time every day. Avoid activities that could cause injury or bruising. Follow instructions about how to prevent falls. Wear a medical alert bracelet or carry a card that lists what medicines you take. Lifestyle  Work with your health care provider to control hypertension. This may include: Exercising regularly. Check with your health care provider before starting a new type of exercise. Eating a heart-healthy diet of fruits, vegetables, whole grains, and lean meats.  Limit unhealthy fats. Eat more healthy fats such as avocados, eggs, and oily fish. Reducing the amount of salt (sodium) that you eat to less than 1,500 mg a day. Reducing stress by doing things that you enjoy and avoiding things that cause you stress. Avoid activities that put you at risk for neck injuries, such as contact sports. Do not use any products that contain nicotine or tobacco. These products include cigarettes, chewing tobacco, and vaping devices, such as e-cigarettes. If you need help quitting, ask your health care provider. General instructions Do not drink alcohol if: Your health care provider tells you not to drink. You are pregnant, may be pregnant, or are planning to become pregnant. If you drink alcohol: Limit how much you have to: 0-1 drink a day for women. 0-2 drinks a day for men. Know how much alcohol is in your drink. In the [place] one drink equals one 12 oz bottle of beer (355 mL), one 5 oz glass of wine (148 mL), or one 1 oz glass of hard liquor (44 mL). Keep all follow-up visits. This is important. Get help right away if: You have any symptoms of a stroke. BE FAST is an easy way to remember the main warning signs of a stroke: B - Balance. Signs are dizziness, sudden trouble walking, or loss of balance. E - Eyes. Signs are trouble seeing or a sudden change in vision. F - Face. Signs are sudden weakness or numbness of the face, or the face or eyelid drooping on one side. A - Arms. Signs are weakness or numbness in an arm. This happens suddenly and usually on one side of the body. S - Speech. Signs are sudden trouble speaking, slurred speech, or trouble understanding what people say. T - Time. Time to call emergency services. Write down what time symptoms started. You have other signs of a stroke, such as: A sudden, severe headache with no known cause. Nausea or vomiting. Seizure. You have other symptoms, such as: Difficulty breathing. Chest pain. These symptoms  may be an emergency. Get help right away. Call 911. Do not wait to see if the symptoms will go away. Do not drive yourself to the hospital. Summary Vertebral artery dissection is a tear in an artery that carries blood from the heart to the brain. Symptoms usually appear within days of an injury, but sometimes they may not appear for weeks or years. This condition increases the risk for stroke if it is not diagnosed and treated right away. Treatment depends on the cause of your condition and on your overall health. The goal of treatment is to prevent a stroke. Get help right away if you have any symptoms of a stroke. This information is not intended to replace advice given to you by your health care provider. Make sure you discuss any questions you have with your health care provider. Document Revised: 03/16/2021 Document Reviewed: 03/16/2021 Elsevier Patient Education  2024 [employer].

## 2023-05-24 NOTE — Telephone Encounter (Signed)
 MRI brain [facility]: [id number] exp. 05/24/23-07/22/23  CTA head/neck [facility]: [id number] exp. 05/24/23-07/22/23 sent to GI [phone]

## 2023-05-24 NOTE — Progress Notes (Signed)
 [facility]  Provider:  Dr Chalice Referring Provider: Desiderio Chew, PA-C Primary Care Physician:  Sciences, [facility]  Chief Complaint  Patient presents with   Follow-up    Patient in room #1 and alone. Patient had an ED visit , angiogram showed possible dissection-.    HPI:  Anne [name] is a 28 y.o. female and seen here upon referral from Dr. Desiderio for a Consultation/ Evaluation of a transient left peripheral vision loss, went to ED and was  suspected to have had a TIA, but later dx with arterial dissection. She presented with neck pain and headaches, got Dilaudid . Besides an MRI of the brain that was performed without contrast there was also a CT angiogram of the head and neck obtained and a CT of the C-spine.  The CT of the C-spine was based on the headache neck pain and vision changes but the patient had reported as main symptoms.  The anterior circulation was normal posterior circulation were patent there was no stenosis this seems sinus venous structures were normal but the CTA that followed showed a multifocal irregularity of the right vertebral artery and here there is a concern of dissection in the setting either of trauma or of fibromuscular dysplasia.  The radiologist describes this as a moderate to severe stenosis of the second branch V2 vertebral artery and at the highest degree between C3 and C4.  Hypodensity in the pons that was speculated upon did not appear to be verified by MRI.  So I would like to review these images today with her and we can  offer a calcium channel blocker and baby ASA to prevent vasospasm.   These findings do not have to be acute, there is a chance of a congenital hypoplastic vertebral artery and thre is a possibility of a previous injury / trauma : see below.   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  She has seen child neurology Dr Anne [name] and was x with complicated migraine.  The patient has seen me for a sleep study recently  09-12-2022- and this study was cancelled because of snow and frost last weekend.          Review of Systems: Out of a complete 14 system review, the patient complains of only the following symptoms, and all other reviewed systems are negative.   Social History   Socioeconomic History   Marital status: Married    Spouse name: Anne [name]   Number of children: 3   Years of education: Not on file   Highest education level: Not on file  Occupational History   Not on file  Tobacco Use   Smoking status: Former   Smokeless tobacco: Never   Tobacco comments:    quit in Jan'21  Vaping Use   Vaping status: Former  Substance and Sexual Activity   Alcohol use: Yes    Comment: Social   Drug use: No   Sexual activity: Yes    Birth control/protection: None  Other Topics Concern   Not on file  Social History Narrative   Anne [name] is in 12 th grade at [employer]. She is doing good this school year.   She lives with her mother. Her siblings live with their father.   Social  Drivers of Corporate Investment Banker Strain: Low Risk  (06/22/2018)   Overall Financial Resource Strain (CARDIA)    Difficulty of Paying Living Expenses: Not hard at all  Food Insecurity: No Food Insecurity (06/22/2018)   Hunger Vital Sign    Worried About Running Out of Food in the Last Year: Never true    Ran Out of Food in the Last Year: Never true  Transportation Needs: Unmet Transportation Needs (06/22/2018)   PRAPARE - Administrator, Civil Service (Medical): Yes    Lack of Transportation (Non-Medical): Not on file  Physical Activity: Not on file  Stress: Stress Concern Present (06/22/2018)   [employer] of Occupational Health - Occupational Stress Questionnaire    Feeling  of Stress : To some extent  Social Connections: Not on file  Intimate Partner Violence: Not At Risk (06/22/2018)   Humiliation, Afraid, Rape, and Kick questionnaire    Fear of Current or Ex-Partner: No    Emotionally Abused: No    Physically Abused: No    Sexually Abused: No    Family History  Problem Relation Age of Onset   Depression Mother    Anxiety disorder Mother    Headache Father    Depression Maternal Grandmother    Anxiety disorder Maternal Grandmother    Thyroid disease Maternal Grandmother    Stroke Maternal Grandmother    Hypertension Maternal Grandmother    Depression Maternal Grandfather    Anxiety disorder Maternal Grandfather    Stroke Maternal Grandfather    Hypertension Maternal Grandfather    Heart disease Maternal Grandfather    Melanoma Paternal Grandmother    Hypertension Paternal Grandfather    Heart disease Paternal Grandfather     Past Medical History:  Diagnosis Date   Anemia    Anxiety    Anxiety and depression    Asthma    Asthma    Depression    doing good   History of cystitis    Infection    UTI   Orthostatic hypotension    Ovarian cyst    Ovarian cyst    ruptured in 3rd grade    Past Surgical History:  Procedure Laterality Date   NO PAST SURGERIES      Current Outpatient Medications  Medication Sig Dispense Refill   albuterol  (PROVENTIL  HFA;VENTOLIN  HFA) 108 (90 Base) MCG/ACT inhaler Inhale 1-2 puffs into the lungs every 6 (six) hours as needed for wheezing or shortness of breath.      aspirin  81 MG chewable tablet [name] 1 tablet (81 mg total) by mouth daily. 90 tablet 0   clopidogrel  (PLAVIX ) 75 MG tablet Take 1 tablet (75 mg total) by mouth daily. 90 tablet 0   methocarbamol  (ROBAXIN ) 500 MG tablet Take 2 tablets (1,000 mg total) by mouth every 8 (eight) hours as needed for muscle spasms. 30 tablet 0   oxyCODONE  (OXY IR/ROXICODONE ) 5 MG immediate release tablet Take 1 tablet (5 mg total) by mouth every 6 (six) hours as  needed for severe pain (pain score 7-10). (Patient not taking: Reported on 05/24/2023) 8 tablet 0   predniSONE (STERAPRED UNI-PAK 21 TAB) 5 MG (21) TBPK tablet Take 5 mg by mouth daily. (Patient not taking: Reported on 05/24/2023)     No current facility-administered medications for this visit.    Allergies as of 05/24/2023   (No Known Allergies)    Vitals: BP 136/78 (BP Location: Left Arm, Patient Position: Sitting, Cuff Size: Normal)   Pulse 90  Ht 5' 1 (1.549 m)   Wt 164 lb (74.4 kg)   LMP 04/10/2023 (Exact Date)   BMI 30.99 kg/m  Last Weight:  Wt Readings from Last 1 Encounters:  05/24/23 164 lb (74.4 kg)   Last Height:   Ht Readings from Last 1 Encounters:  05/24/23 5' 1 (1.549 m)   Last BMI: @LASTBMI  Physical exam:  General: The patient is awake, alert and appears not in acute distress.  The patient is well groomed. Head: Normocephalic, atraumatic.  Neck is restricted with turns to the left .  Neurologic exam : The patient is awake and alert, oriented to place and time.  Memory subjective  described as intact.  There is a normal attention span & concentration ability.  Speech is fluent without  dysarthria, dysphonia or aphasia.  Mood and affect are appropriate.  Cranial nerves: Pupils are equal and briskly reactive to light. Funduscopic exam without  evidence of pallor or edema. Extraocular movements  in vertical and horizontal planes intact and without nystagmus. Visual fields by finger perimetry are intact. Hearing to finger rub intact.  Facial sensation intact to fine touch.  Facial motor strength is symmetric and tongue and uvula move midline.  Motor exam:   Normal tone and normal muscle bulk and symmetric normal strength in all extremities. Grip Strength intact. Proximal strength of shoulder muscles and hip flexors was intact .  Sensory:  Fine touch and vibration were tested . Proprioception was tested in the upper extremities only and was  normal. Left hand  numbness for 7 days.   Coordination: Rapid alternating movements in the fingers/hands were normal.  Finger-to-nose maneuver was tested and showed no evidence of ataxia, dysmetria or tremor.  Gait and station: Patient walked without assistive device - Core Strength within normal limits. Stance is stable and of normal base.   Deep tendon reflexes: in the  upper and lower extremities are symmetric   Assessment: Total time for face to face interview and examination, for review of  images and laboratory testing, neurophysiology testing and pre-existing records, including out-of -network , was 30 minutes. 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   Plan:  Treatment plan and additional workup planned after today includes:   1)  baby ASA- to be continued.  Finish Plavix  for 90 days.  2)  start Verapamil  180 mg at bedtime.   3) repeat MRI brain and MRA brain and neck after 30 days to verify  that stenosis persists and that no ishcemia was caused by this.   Dedra Gores, MD

## 2023-06-01 ENCOUNTER — Ambulatory Visit
Admission: RE | Admit: 2023-06-01 | Discharge: 2023-06-01 | Disposition: A | Discharge status: Home / Self Care | Payer: Medicaid Other | Source: Ambulatory Visit | Attending: Neurology | Admitting: Neurology

## 2023-06-01 DIAGNOSIS — I7774 Dissection of vertebral artery: Secondary | ICD-10-CM

## 2023-06-01 DIAGNOSIS — H53452 Other localized visual field defect, left eye: Secondary | ICD-10-CM

## 2023-06-01 DIAGNOSIS — G478 Other sleep disorders: Secondary | ICD-10-CM

## 2023-06-01 MED ORDER — IOPAMIDOL (ISOVUE-370) INJECTION 76%
500.0000 mL | Freq: Once | INTRAVENOUS | Status: AC | PRN
  Administered 2023-06-01: 70 mL via INTRAVENOUS

## 2023-06-11 ENCOUNTER — Ambulatory Visit (INDEPENDENT_AMBULATORY_CARE_PROVIDER_SITE_OTHER): Payer: Medicaid Other | Admitting: Neurology

## 2023-06-11 DIAGNOSIS — G4710 Hypersomnia, unspecified: Secondary | ICD-10-CM

## 2023-06-11 DIAGNOSIS — G478 Other sleep disorders: Secondary | ICD-10-CM

## 2023-06-11 DIAGNOSIS — R6889 Other general symptoms and signs: Secondary | ICD-10-CM

## 2023-06-11 DIAGNOSIS — T733XXS Exhaustion due to excessive exertion, sequela: Secondary | ICD-10-CM

## 2023-06-11 DIAGNOSIS — G4719 Other hypersomnia: Secondary | ICD-10-CM

## 2023-06-12 ENCOUNTER — Other Ambulatory Visit: Payer: Self-pay | Admitting: Neurology

## 2023-06-12 ENCOUNTER — Other Ambulatory Visit: Payer: Medicaid Other

## 2023-06-12 ENCOUNTER — Ambulatory Visit (INDEPENDENT_AMBULATORY_CARE_PROVIDER_SITE_OTHER): Payer: Medicaid Other | Admitting: Neurology

## 2023-06-12 ENCOUNTER — Encounter: Payer: Self-pay | Admitting: Neurology

## 2023-06-12 DIAGNOSIS — G4710 Hypersomnia, unspecified: Secondary | ICD-10-CM

## 2023-06-12 DIAGNOSIS — G4712 Idiopathic hypersomnia without long sleep time: Secondary | ICD-10-CM | POA: Diagnosis not present

## 2023-06-12 DIAGNOSIS — G4719 Other hypersomnia: Secondary | ICD-10-CM

## 2023-06-12 DIAGNOSIS — T733XXS Exhaustion due to excessive exertion, sequela: Secondary | ICD-10-CM

## 2023-06-12 DIAGNOSIS — R6889 Other general symptoms and signs: Secondary | ICD-10-CM

## 2023-06-12 DIAGNOSIS — G478 Other sleep disorders: Secondary | ICD-10-CM

## 2023-06-14 LAB — COMPREHENSIVE DRUG ANALYSIS,UR

## 2023-06-16 ENCOUNTER — Ambulatory Visit
Admission: RE | Admit: 2023-06-16 | Discharge: 2023-06-16 | Disposition: A | Discharge status: Home / Self Care | Payer: Medicaid Other | Source: Ambulatory Visit | Attending: Neurology | Admitting: Neurology

## 2023-06-16 DIAGNOSIS — H53452 Other localized visual field defect, left eye: Secondary | ICD-10-CM

## 2023-06-16 DIAGNOSIS — I7774 Dissection of vertebral artery: Secondary | ICD-10-CM

## 2023-06-16 DIAGNOSIS — G478 Other sleep disorders: Secondary | ICD-10-CM

## 2023-06-19 ENCOUNTER — Telehealth: Payer: Self-pay | Admitting: Neurology

## 2023-06-19 ENCOUNTER — Encounter: Payer: Self-pay | Admitting: Neurology

## 2023-06-19 NOTE — Telephone Encounter (Signed)
 LVM and sent mychart msg informing pt of need to reschedule 08/22/23 appt - MD out

## 2023-06-22 ENCOUNTER — Encounter: Payer: Self-pay | Admitting: Neurology

## 2023-06-29 ENCOUNTER — Encounter: Payer: Self-pay | Admitting: Neurology

## 2023-06-29 DIAGNOSIS — T733XXA Exhaustion due to excessive exertion, initial encounter: Secondary | ICD-10-CM | POA: Insufficient documentation

## 2023-06-29 NOTE — Procedures (Signed)
[facility]     Name: Anne [name], Anne [name]. MRN: [id number]  Study Date: 06/12/2023 Procedure #: 0 DOB: 1995-09-17  Protocol This is a 13 channel Multiple Sleep Latency Test comprised of 5 channels of EEG (T3-Cz, Cz-T4, F4-M1, C4-M1, O2-M1). 3 channels of Chin EMG, 4 channels of EOG and 1 channel for ECG. All channels were sampled at 256 Hz.  This polysomnographic procedure is designed to evaluate (1) the complaint of excessive daytime sleepiness by quantifying the time required to fall asleep and (2) the possibility of narcolepsy by checking for abnormally short latencies to REM sleep. Electrographic variables include EEG, EMG, EOG and ECG. Patients are monitored throughout four or five 20-minute opportunities to sleep (naps) at two-hour intervals. For each nap, the patient is allowed 20 minutes to fall asleep. Once asleep, the patient is awakened after 15 minutes. Between naps, the patient is kept as alert as possible. A sleep latency of 20 minutes indicates that no sleep occurred. Parametric Analysis Total Number of Naps 5   NAP # Time of Nap Sleep Latency (mins) REM Latency (mins) Sleep Time Percent Awake Time Percent   1 08:39 3.0 20.0 83 17    2 10:26 10.5 20.0 59 41    3 12:23 6.0 20.0 69 31    4 14:25 12.5 20.0 55 45    5 16:13 14.5 20.0 44 56      General Information  Name: Anne [name], Anne [name] BMI: 30 Physician: Melvyn Novas, MD  ID: [id number] Height: 61 in Technician: Irene Pap RPSGT  Sex: Female Weight: 164 lbs Record: [id number]  Age: 28 [05/29/1995] Date: 06/12/2023 Scorer: Irene Pap RPSGT   Nap 1 Nap Start: 08:39:26 AM Nap End: 08:57:28 AM    Latency to Sleep Onset: 3.70m Total Sleep Time: 15.56m Stg Dur REM N1 N2 N3   0.23m 2.81m 13.10m 0.65m    Nap 2 Nap Start: 10:26:26 AM Nap End: 10:52:02 AM    Latency to Sleep Onset: 10.67m Total Sleep Time: 15.67m Stg Dur REM N1 N2 N3   0.77m 2.60m 12.47m 0.52m    Nap 3 Nap Start: 12:23:04 PM Nap  End: 12:44:04 PM    Latency to Sleep Onset: 6.17m Total Sleep Time: 14.17m Stg Dur REM N1 N2 N3   0.58m 2.7m 12.81m 0.53m    Nap 4 Nap Start: 02:25:46 PM Nap End: 02:53:17 PM    Latency to Sleep Onset: 12.56m Total Sleep Time: 15.10m Stg Dur REM N1 N2 N3   0.60m 2.25m 12.28m 0.6m    Nap 5 Nap Start: 04:13:39 PM Nap End: 04:43:13 PM    Latency to Sleep Onset: 14.1m Total Sleep Time: 13.72m Stg Dur REM N1 N2 N3   0.5m 3.108m 10.42m 0.45m    Mean Sleep Latency: 9.37m Number of Sleep Onset REM Periods: 0   MSLT Summary of Naps  Sleepiness Index: 53.5  Mean Sleep Latency: in minutes  9.3  Number of Naps with REM Sleep: 0 out of 5   Results from Preceding PSG Study  Sleep Onset Time 23:17 Sleep Efficiency (%) 86.49%  Rise Time 06:16 Sleep Latency (min) 52.63min  Total Sleep Time 6h 46.25min REM Latency (min) 1h       Name: Anne [name], Anne [name] MRN: [id number]  Study Date: 06/12/2023 Procedure #: 0 DOB: 1995/07/08    IMPRESSION:  This MSLT documented 5 sleep onsets in 5 naps, but no REM sleep onset in any of these naps. The diagnosis is idiopathic hypersomnia, narcolepsy  could not be diagnosed.    RECOMMENDATIONS: Treatment for idiopathic hypersomnia   I attest to having reviewed every epoch of the entire raw data recording prior to the issuance of this report in accordance with the Standards of the American Academy of Sleep Medicine. Melvyn Novas, MD
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
## 2023-07-10 ENCOUNTER — Encounter: Payer: Self-pay | Admitting: Neurology

## 2023-07-10 ENCOUNTER — Ambulatory Visit (INDEPENDENT_AMBULATORY_CARE_PROVIDER_SITE_OTHER): Payer: Medicaid Other | Admitting: Neurology

## 2023-07-10 VITALS — BP 122/83 | HR 88 | Ht 61.0 in | Wt 166.2 lb

## 2023-07-10 DIAGNOSIS — G4719 Other hypersomnia: Secondary | ICD-10-CM

## 2023-07-10 DIAGNOSIS — G4712 Idiopathic hypersomnia without long sleep time: Secondary | ICD-10-CM | POA: Diagnosis not present

## 2023-07-10 DIAGNOSIS — G4711 Idiopathic hypersomnia with long sleep time: Secondary | ICD-10-CM | POA: Insufficient documentation

## 2023-07-10 DIAGNOSIS — I7774 Dissection of vertebral artery: Secondary | ICD-10-CM

## 2023-07-10 MED ORDER — AMPHETAMINE-DEXTROAMPHET ER 20 MG PO CP24: 20.0000 mg | ORAL_CAPSULE | Freq: Every day | ORAL | 0 refills | Status: DC

## 2023-07-10 NOTE — Patient Instructions (Signed)
 Hypersomnia   IDIOPATHIC ( in contrast to Hypersomnia with Narcolepsy).  Hypersomnia is a condition in which a person feels very tired during the day even though the person gets plenty of sleep at night. A person with this condition may take naps during the day and may find it very difficult to wake up from sleep. Hypersomnia may affect a person's ability to think, concentrate, drive, or remember things. What are the causes? The cause of this condition may not be known. Possible causes include: Taking certain medicines. Using drugs or alcohol. Sleep disorders, such as narcolepsy and sleep apnea. Injury to the head, brain, or spinal cord. Tumors. Certain medical conditions. These include: Depression. Diabetes. Gastroesophageal reflux disease (GERD). An underactive thyroid gland (hypothyroidism). What are the signs or symptoms? The main symptoms of hypersomnia include: Feeling very tired throughout the day, regardless of how much sleep you got the night before. Having trouble waking up. Others may find it difficult to wake you up when you are sleeping. Sleeping for longer and longer periods at a time. Taking naps throughout the day. Other symptoms may include: Feeling restless, anxious, or annoyed. Lacking energy. Having trouble with: Remembering. Speaking. Thinking. Loss of appetite. Seeing, hearing, tasting, smelling, or feeling things that are not real (hallucinations). How is this diagnosed? This condition may be diagnosed based on: Your symptoms and medical history. Your sleeping habits. Your health care provider may ask you to write down your sleeping habits in a daily sleep log, along with any symptoms you have. A series of tests that are done while you sleep (sleep study or polysomnogram). A test that measures how quickly you can fall asleep during the day (daytime nap study or multiple sleep latency test). How is this treated? This condition may be treated  by: Following a regular sleep routine. Making lifestyle changes, such as changing your eating habits, getting regular exercise, and avoiding alcohol or caffeinated beverages. Taking medicines to make you more alert (stimulants) during the day. Treating any underlying medical causes of hypersomnia. Follow these instructions at home: Sleep habits Stick to a routine that includes going to bed and waking up at the same times every day and night. Practice a relaxing bedtime routine. This may include reading, meditation, deep breathing, or taking a warm bath before going to sleep. Exercise regularly as told by your health care provider. However, avoid exercising in the hours right before bedtime. Keep your sleep environment at a cooler temperature, darkened, and quiet. Sleep with pillows and a mattress that are comfortable and supportive. Schedule short 20-minute naps for when you feel sleepiest during the day. Talk with your employer or teachers about your hypersomnia. If possible, adjust your schedule so that: You have a regular daytime work schedule. You can take a scheduled nap during the day. You do not have to work or be active at night. Do not eat a heavy meal for a few hours before bedtime. Eat your meals at about the same times every day. Safety  Do not drive or use machinery if you are sleepy. Ask your health care provider if it is safe for you to drive. Wear a life jacket when swimming or spending time near water. General instructions  Take over-the-counter and prescription medicines only as told by your health care provider. This includes supplements. Avoid drinking alcohol or caffeinated beverages. Keep a sleep log that will help your health care provider manage your condition. This may include information about: What time you go to bed each  night. How often you wake up at night. How many hours you sleep at night. How often and for how long you nap during the day. Any  observations from others, such as leg movements during sleep, sleep walking, or snoring. Keep all follow-up visits. This is important. Contact a health care provider if: You have new symptoms. Your symptoms get worse. Get help right away if: You have thoughts about hurting yourself or someone else. Get help right away if you feel like you may hurt yourself or others, or have thoughts about taking your own life. Go to your nearest emergency room or: Call 911. Call the National Suicide Prevention Lifeline at 9597712985 or 988. This is open 24 hours a day. Text the Crisis Text Line at 904-662-0903. Summary Hypersomnia refers to a condition in which you feel very tired during the day even though you get plenty of sleep at night. A person with this condition may take naps during the day and may find it very difficult to wake up from sleep. Hypersomnia may affect a person's ability to think, concentrate, drive, or remember things. Treatment may include a regular sleep routine and making some lifestyle changes. This information is not intended to replace advice given to you by your health care provider. Make sure you discuss any questions you have with your health care provider. Document Revised: 04/12/2021 Document Reviewed: 04/12/2021 Elsevier Patient Education  2024 [employer].

## 2023-07-10 NOTE — Progress Notes (Signed)
 Provider:  Melvyn Novas, MD    SLEEP MEDICINE CLINIC'  Primary Care Physician:  [name], NP [address] [place] [address]     Referring Provider: [name], Np [address] [place],  [place] [address]          Chief Complaint according to patient   Patient presents with:          Follow up on MSLT, negative for SREM onset.   Had HLA test positive.       HISTORY OF PRESENT ILLNESS:  Anne [name] is a 28 y.o. female patient who is here for revisit 07/10/2023 for  follow up on MRI, MRA and MSLT,    This 28 year-old Female patient was seen on 09-12-2022 for a narcolepsy work up-  HLA test positive. Last time an evaluation was incomplete due to the patient's pregnancy at the time. Avg about 7 hrs of sleep at night and still wakes up feeling tired. Vivid dreams, but not as frequent- not waking up from it. Occasionally waking up disoriented to time, not place. No cataplexy , but reports being clumsy.   PSG : Based on AASM criteria (using a 3% oxygen desaturation and /or arousal rule for scoring hypopneas), there were 2 apneas (1 obstructive; 1 central; 0 mixed), and 14 hypopneas. Apnea index was 0.3. Hypopnea index was 2.1. The apnea-hypopnea index was 2.4 overall (3.4 supine, 2 non-supine; 7.4 REM, 11.4 supine REM). There were 0 respiratory effort-related arousals (RERAs). Valid PSG for MSLT to follow.  This MSLT documented 5 sleep onsets in 5 naps, but no REM sleep onset in any of these naps. The diagnosis is idiopathic hypersomnia, narcolepsy could not be diagnosed.    RECOMMENDATIONS: Treatment for idiopathic hypersomnia    Neck pain:    CT A :  right vertebral artery flow with restricted lumen-  limitation, C3-C4 level in height- abnormal retrograde flow into the  area of the right V4.    Pain is on the right, supporting the dx of a possible dissection.   Lifting restrictions, 20 pounds for now until the neck pain is  resolved.   MRI : no stroke is seen, no  white matter disease.       The Epworth Sleepiness Scale endorsed at 15 /24 points (scores above or equal to 10 are suggestive of hypersomnolence). FSS endorsed at  40 /63 points.   OLIVEA [name] is a 28 y.o. female and seen here upon referral from Dr. Emmit Alexanders for a Consultation/ Evaluation of a transient left peripheral vision loss, went to ED and was  suspected to have had a TIA, but later dx with arterial dissection. She presented with neck pain and headaches, got Dilaudid. Besides an MRI of the brain that was performed without contrast there was also a CT angiogram of the head and neck obtained and a CT of the C-spine.  The CT of the C-spine was based on the headache neck pain and vision changes but the patient had reported as main symptoms.  The anterior circulation was normal posterior circulation were patent there was no stenosis this seems sinus venous structures were normal but the CTA that followed showed a multifocal irregularity of the right vertebral artery and here there is a concern of dissection in the setting either of trauma or of fibromuscular dysplasia.  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  Hypodensity in the pons that was speculated upon did not appear to be verified by MRI.  So I would like to review these images today with her and we can  offer a calcium channel blocker and baby ASA to prevent vasospasm.    These findings do not have to be acute, there is a chance of a congenital hypoplastic vertebral artery and thre is a possibility of a previous injury / trauma : see below.    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  She has seen child  neurology Dr Shirlee More and was x with complicated migraine.  The patient has seen me for a sleep study recently  09-12-2022- and this study was cancelled because of snow and frost last weekend.     .  Chief concern according to patient :  what do we treat me with.   Idiopathic hypersomnia (IH) is a chronic sleep disorder characterized by excessive daytime sleepiness despite getting adequate sleep at night. Treatment options for IH include:  Medications:  Stimulants: Modafinil, armodafinil, methylphenidate, and amphetamine can help improve wakefulness and reduce daytime sleepiness.  Sodium oxybate: This medication is a sedative that can improve sleep quality and reduce daytime sleepiness.  Antidepressants: Certain antidepressants, such as venlafaxine and desvenlafaxine, may help improve sleepiness and mood.  Lifestyle Changes:  Regular sleep schedule: Establish and maintain a consistent sleep-wake cycle, even on weekends.  Avoid caffeine and alcohol: These substances can interfere with sleep and worsen symptoms.  Exercise regularly: Moderate-intensity exercise can improve sleep quality and reduce daytime sleepiness.  Consider light therapy: Exposure to bright light in the morning can help regulate the body's natural sleep-wake cycle.  Other Therapies:  Cognitive behavioral therapy (CBT-I): This therapy can help patients identify and change sleep-disrupting behaviors.     Fam Hx; 4 children - there is no way she could use Xyrem, she is a caretaker. She cannot be non-responsive.   Husband I has insomnia and is not able to go back to sleep when interrupted.     Review of Systems: Out of a complete 14 system review, the patient complains of only the following symptoms, and all other reviewed systems are negative.:   How likely are you to doze in the following situations: 0 = not likely, 1 = slight chance, 2 = moderate chance, 3 = high chance  Sitting and Reading?2 Watching  Television?2 Sitting inactive in a public place (theater or meeting)?2 Lying down in the afternoon when circumstances permit?2 Sitting and talking to someone?1 Sitting quietly after lunch without alcohol?3 In a car, while stopped for a few minutes in traffic? 2 As a passenger in a car for an hour without a break? 2  Total = 15/ 24 .   Social History   Socioeconomic History   Marital status: Married    Spouse name: Eliberto Ivory   Number of children: 3   Years of education: Not on file   Highest education level: Not on file  Occupational History   Not on file  Tobacco Use   Smoking status: Former   Smokeless tobacco: Never   Tobacco comments:    quit in Jan'21  Vaping Use  Vaping status: Former  Substance and Sexual Activity   Alcohol use: Yes    Comment: Social   Drug use: No   Sexual activity: Yes    Birth control/protection: None  Other Topics Concern   Not on file  Social History Narrative   Ivet is in 12 th grade at [employer]. She is doing good this school year.   She lives with her mother. Her siblings live with their father.   Social Drivers of Corporate investment banker Strain: Low Risk  (06/22/2018)   Overall Financial Resource Strain (CARDIA)    Difficulty of Paying Living Expenses: Not hard at all  Food Insecurity: No Food Insecurity (06/22/2018)   Hunger Vital Sign    Worried About Running Out of Food in the Last Year: Never true    Ran Out of Food in the Last Year: Never true  Transportation Needs: Unmet Transportation Needs (06/22/2018)   PRAPARE - Administrator, Civil Service (Medical): Yes    Lack of Transportation (Non-Medical): Not on file  Physical Activity: Not on file  Stress: Stress Concern Present (06/22/2018)   [employer] of Occupational Health - Occupational Stress Questionnaire    Feeling of Stress : To some extent  Social Connections: Not on file    Family History  Problem Relation Age of Onset    Depression Mother    Anxiety disorder Mother    Headache Father    Depression Maternal Grandmother    Anxiety disorder Maternal Grandmother    Thyroid disease Maternal Grandmother    Stroke Maternal Grandmother    Hypertension Maternal Grandmother    Depression Maternal Grandfather    Anxiety disorder Maternal Grandfather    Stroke Maternal Grandfather    Hypertension Maternal Grandfather    Heart disease Maternal Grandfather    Melanoma Paternal Grandmother    Hypertension Paternal Grandfather    Heart disease Paternal Grandfather     Past Medical History:  Diagnosis Date   Anemia    Anxiety    Anxiety and depression    Asthma    Asthma    Daytime sleepiness    Depression    doing good   History of cystitis    Infection    UTI   Orthostatic hypotension    Ovarian cyst    Ovarian cyst    "ruptured in 3rd grade"    Past Surgical History:  Procedure Laterality Date   NO PAST SURGERIES       Current Outpatient Medications on File Prior to Visit  Medication Sig Dispense Refill   albuterol (PROVENTIL HFA;VENTOLIN HFA) 108 (90 Base) MCG/ACT inhaler Inhale 1-2 puffs into the lungs every 6 (six) hours as needed for wheezing or shortness of breath.      aspirin 81 MG chewable tablet Chew 1 tablet (81 mg total) by mouth daily. 90 tablet 0   verapamil (CALAN-SR) 180 MG CR tablet Take 1 tablet (180 mg total) by mouth at bedtime. 90 tablet 3   clopidogrel (PLAVIX) 75 MG tablet Take 1 tablet (75 mg total) by mouth daily. (Patient not taking: Reported on 07/10/2023) 90 tablet 0   methocarbamol (ROBAXIN) 500 MG tablet Take 2 tablets (1,000 mg total) by mouth every 8 (eight) hours as needed for muscle spasms. (Patient not taking: Reported on 07/10/2023) 30 tablet 0   oxyCODONE (OXY IR/ROXICODONE) 5 MG immediate release tablet Take 1 tablet (5 mg total) by mouth every 6 (six) hours as needed  for severe pain (pain score 7-10). (Patient not taking: Reported on 07/10/2023) 8 tablet 0    predniSONE (STERAPRED UNI-PAK 21 TAB) 5 MG (21) TBPK tablet Take 5 mg by mouth daily. (Patient not taking: Reported on 07/10/2023)     No current facility-administered medications on file prior to visit.    No Known Allergies   DIAGNOSTIC DATA (LABS, IMAGING, TESTING) - I reviewed patient records, labs, notes, testing and imaging myself where available.  Lab Results  Component Value Date   WBC 7.8 05/03/2023   HGB 12.2 05/03/2023   HCT 37.9 05/03/2023   MCV 91.8 05/03/2023   PLT 342 05/03/2023      Component Value Date/Time   NA 136 05/03/2023 1401   K 3.4 (L) 05/03/2023 1401   CL 105 05/03/2023 1401   CO2 27 05/03/2023 1401   GLUCOSE 91 05/03/2023 1401   BUN 9 05/03/2023 1401   CREATININE 0.57 05/03/2023 1401   CREATININE 0.59 02/20/2015 1403   CALCIUM 8.8 (L) 05/03/2023 1401   PROT 7.2 01/25/2022 1200   ALBUMIN 4.0 01/25/2022 1200   AST 28 01/25/2022 1200   ALT 21 01/25/2022 1200   ALKPHOS 71 01/25/2022 1200   BILITOT 0.6 01/25/2022 1200   GFRNONAA >60 05/03/2023 1401   GFRAA >60 04/06/2018 0226   Lab Results  Component Value Date   CHOL 153 02/20/2015   HDL 54 02/20/2015   LDLCALC 77 02/20/2015   TRIG 110 02/20/2015   CHOLHDL 2.8 02/20/2015   Lab Results  Component Value Date   HGBA1C 5.5 02/20/2015   No results found for: "VITAMINB12" Lab Results  Component Value Date   TSH 1.646 02/20/2015    PHYSICAL EXAM:  Today's Vitals   07/10/23 1108  BP: 122/83  Pulse: 88  Weight: 166 lb 3.2 oz (75.4 kg)  Height: 5\' 1"  (1.549 m)   Body mass index is 31.4 kg/m.   Wt Readings from Last 3 Encounters:  07/10/23 166 lb 3.2 oz (75.4 kg)  05/24/23 164 lb (74.4 kg)  05/03/23 163 lb (73.9 kg)     Ht Readings from Last 3 Encounters:  07/10/23 5\' 1"  (1.549 m)  05/24/23 5\' 1"  (1.549 m)  05/03/23 5\' 1"  (1.549 m)      General: The patient is awake, alert and appears not in acute distress. The patient is well groomed. Head: Normocephalic, atraumatic. Neck is  supple. The patient is awake, alert and appears not in acute distress.  The patient is well groomed. Head: Normocephalic, atraumatic.  Neck is restricted with turns to the left .   Neurologic exam : The patient is awake and alert, oriented to place and time.  Memory subjective  described as intact.  There is a normal attention span & concentration ability.  Speech is fluent without  dysarthria, dysphonia or aphasia.  Mood and affect are appropriate.   Cranial nerves: Pupils are equal and briskly reactive to light. Funduscopic exam without  evidence of pallor or edema. Extraocular movements  in vertical and horizontal planes intact and without nystagmus. Visual fields by finger perimetry are intact. Hearing to finger rub intact.  Facial sensation intact to fine touch.  Facial motor strength is symmetric and tongue and uvula move midline.   Motor exam:   Normal tone and normal muscle bulk and symmetric normal strength in all extremities. Grip Strength intact. Proximal strength of shoulder muscles and hip flexors was intact .   Sensory:  Fine touch and vibration were tested . Proprioception  was tested in the upper extremities only and was  normal. Left hand numbness for 7 days.    Coordination: Rapid alternating movements in the fingers/hands were normal.  Finger-to-nose maneuver was tested and showed no evidence of ataxia, dysmetria or tremor.   Gait and station: Patient walked without assistive device - Core Strength within normal limits. Stance is stable and of normal base.   Deep tendon reflexes: in the  upper and lower extremities are symmetric and intact.  Babinski response was deferred .    ASSESSMENT AND PLAN 28 y.o. 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 Narcolepsy is still my clinical dx, but for insurance  purpose need to go with IHS>   3)  needing to start on Provigil. 200 mg po daily to reduce risk when driving- but its not covered by her Medicaid BCBS plan. She tested negative for OSA, is not a shift worker, not narcoleptic . '  4) had been on adderall  for ADD , by [name], NP , and this is what we need to go with for sleepiness.    I don't want to put a patient with a vascular dissection on vaso-constricting medication.  This leaves adderall or ritalin.  She is willing to go back on Adderall, but I am not satisfied. We start with 20 mg XR for EDS and not for ADD, but I want her to revisit with [name], NP, and see if she has additional help to offer.    I plan to follow up  through our NP within 6 months.   I would like to thank [name], NP and [name], Np [address] [place],  [place] [address] for allowing me to meet with and to take care of this pleasant patient.     After spending a total time of  35  minutes face to face and additional time for physical and neurologic examination, review of laboratory studies,  personal review of imaging studies, reports and results of other testing and review of referral information / records as far as provided in visit,   Electronically signed by: Melvyn Novas, MD 07/10/2023 11:51 AM  [facility] Sleep Board certified by The [employer] of Sleep Medicine and Diplomate of the [employer] of Sleep Medicine. Board certified In Neurology through the ABPN, Fellow of the [employer] of Neurology.

## 2023-08-10 ENCOUNTER — Other Ambulatory Visit: Payer: Self-pay | Admitting: Neurology

## 2023-08-10 NOTE — Telephone Encounter (Signed)
 Pt called stating that the pharmacy is needing a new Rx for this medication. Please advise.

## 2023-08-12 ENCOUNTER — Encounter (HOSPITAL_BASED_OUTPATIENT_CLINIC_OR_DEPARTMENT_OTHER): Payer: Self-pay | Admitting: Emergency Medicine

## 2023-08-12 ENCOUNTER — Other Ambulatory Visit: Payer: Self-pay

## 2023-08-12 ENCOUNTER — Emergency Department (HOSPITAL_BASED_OUTPATIENT_CLINIC_OR_DEPARTMENT_OTHER)
Admission: EM | Admit: 2023-08-12 | Discharge: 2023-08-13 | Disposition: A | Discharge status: Home / Self Care | Attending: Emergency Medicine | Admitting: Emergency Medicine

## 2023-08-12 DIAGNOSIS — Z7901 Long term (current) use of anticoagulants: Secondary | ICD-10-CM | POA: Insufficient documentation

## 2023-08-12 DIAGNOSIS — J45909 Unspecified asthma, uncomplicated: Secondary | ICD-10-CM | POA: Diagnosis not present

## 2023-08-12 DIAGNOSIS — I7774 Dissection of vertebral artery: Secondary | ICD-10-CM | POA: Diagnosis not present

## 2023-08-12 DIAGNOSIS — R42 Dizziness and giddiness: Secondary | ICD-10-CM | POA: Diagnosis present

## 2023-08-12 NOTE — ED Provider Notes (Signed)
 [place] [facility] Provider Note   CSN: [id number] Arrival date & time: 08/12/23  2214     History {Add pertinent medical, surgical, social history, OB history to HPI:1} Chief Complaint  Patient presents with   Neck Pain    Anne [name] is a 28 y.o. female.  The history is provided by the patient.  Neck Pain She has history of asthma, anxiety and depression, vertebral artery dissection and noted today that she had increased pain in her neck and like she was dizzy when she is bent forward.  She denies headache and denies weakness or numbness or tingling.  She denies any visual changes.  She denies any recent trauma.   Home Medications Prior to Admission medications   Medication Sig Start Date End Date Taking? Authorizing Provider  albuterol (PROVENTIL HFA;VENTOLIN HFA) 108 (90 Base) MCG/ACT inhaler Inhale 1-2 puffs into the lungs every 6 (six) hours as needed for wheezing or shortness of breath.     [provider]  amphetamine-dextroamphetamine (ADDERALL XR) 20 MG 24 hr capsule TAKE ONE CAPSULE BY MOUTH DAILY 08/10/23   Dohmeier, Porfirio Mylar, MD  aspirin 81 MG chewable tablet Chew 1 tablet (81 mg total) by mouth daily. 05/03/23   Renne Crigler, PA-C  clopidogrel (PLAVIX) 75 MG tablet Take 1 tablet (75 mg total) by mouth daily. Patient not taking: Reported on 07/10/2023 05/03/23   Renne Crigler, PA-C  methocarbamol (ROBAXIN) 500 MG tablet Take 2 tablets (1,000 mg total) by mouth every 8 (eight) hours as needed for muscle spasms. Patient not taking: Reported on 07/10/2023 05/03/23   Renne Crigler, PA-C  oxyCODONE (OXY IR/ROXICODONE) 5 MG immediate release tablet Take 1 tablet (5 mg total) by mouth every 6 (six) hours as needed for severe pain (pain score 7-10). Patient not taking: Reported on 07/10/2023 05/03/23   Renne Crigler, PA-C  predniSONE (STERAPRED UNI-PAK 21 TAB) 5 MG (21) TBPK tablet Take 5 mg by mouth daily. Patient not taking:  Reported on 07/10/2023    [provider]  verapamil (CALAN-SR) 180 MG CR tablet Take 1 tablet (180 mg total) by mouth at bedtime. 05/24/23   Dohmeier, Porfirio Mylar, MD      Allergies    Patient has no known allergies.    Review of Systems   Review of Systems  Musculoskeletal:  Positive for neck pain.  All other systems reviewed and are negative.   Physical Exam Updated Vital Signs BP 123/79 (BP Location: Right Arm)   Pulse 80   Temp 98.2 F (36.8 C)   Resp 16   Ht 5\' 1"  (1.549 m)   Wt 75.4 kg   LMP 07/19/2023   SpO2 100%   BMI 31.41 kg/m  Physical Exam Vitals and nursing note reviewed.   28 year old female, resting comfortably and in no acute distress. Vital signs are normal. Oxygen saturation is 100%, which is normal. Head is normocephalic and atraumatic. PERRLA, EOMI. Oropharynx is clear. Neck is moderately tender in the upper cervical region and in the paracervical muscles bilaterally. Lungs are clear without rales, wheezes, or rhonchi. Chest is nontender. Heart has regular rate and rhythm without murmur. Abdomen is soft, flat, nontender. Extremities have no cyanosis or edema, full range of motion is present. Skin is warm and dry without rash. Neurologic: Mental status is normal, cranial nerves are intact, strength is 5/5 in all 4 extremities.  ED Results / Procedures / Treatments   Labs (all labs ordered are listed, but  only abnormal results are displayed) Labs Reviewed - No data to display  Radiology No results found.  Procedures Procedures  {Document cardiac monitor, telemetry assessment procedure when appropriate:1}  Medications Ordered in ED Medications - No data to display  ED Course/ Medical Decision Making/ A&P   {   Click here for ABCD2, HEART and other calculatorsREFRESH Note before signing :1}                              Medical Decision Making  Neck pain without any neurologic findings and patient with history of vertebral artery  dissection.  However, with dizziness is part of her symptoms, I feel it is necessary to repeat CT angiogram to make sure she does not have recurrent vertebral artery dissection.  I reviewed her past records, and she was seen on 05/03/2023 for vertebral artery dissection at which time she had visual field changes and a headache.  She was treated with dual antiplatelet therapy.  She has completed 90 days of clopidogrel and is currently on verapamil  {Document critical care time when appropriate:1} {Document review of labs and clinical decision tools ie heart score, Chads2Vasc2 etc:1}  {Document your independent review of radiology images, and any outside records:1} {Document your discussion with family members, caretakers, and with consultants:1} {Document social determinants of health affecting pt's care:1} {Document your decision making why or why not admission, treatments were needed:1} Final Clinical Impression(s) / ED Diagnoses Final diagnoses:  None    Rx / DC Orders ED Discharge Orders     None

## 2023-08-12 NOTE — ED Triage Notes (Signed)
 Pt with posterior neck pain that started about 3 hrs ago, no injury; also reports intermittent dizziness since Dec; hx of vertebral artery dissection in Dec after she popped her neck

## 2023-08-13 ENCOUNTER — Other Ambulatory Visit: Payer: Self-pay

## 2023-08-13 ENCOUNTER — Encounter (HOSPITAL_BASED_OUTPATIENT_CLINIC_OR_DEPARTMENT_OTHER): Payer: Self-pay

## 2023-08-13 ENCOUNTER — Emergency Department (HOSPITAL_COMMUNITY)
Admission: EM | Admit: 2023-08-13 | Discharge: 2023-08-13 | Disposition: A | Discharge status: Home / Self Care | Source: Home / Self Care | Attending: Emergency Medicine | Admitting: Emergency Medicine

## 2023-08-13 ENCOUNTER — Emergency Department (HOSPITAL_BASED_OUTPATIENT_CLINIC_OR_DEPARTMENT_OTHER)

## 2023-08-13 ENCOUNTER — Emergency Department (HOSPITAL_COMMUNITY)

## 2023-08-13 DIAGNOSIS — I7774 Dissection of vertebral artery: Secondary | ICD-10-CM

## 2023-08-13 LAB — CBC WITH DIFFERENTIAL/PLATELET
Abs Immature Granulocytes: 0.05 10*3/uL (ref 0.00–0.07)
Basophils Absolute: 0.1 10*3/uL (ref 0.0–0.1)
Basophils Relative: 1 %
Eosinophils Absolute: 0.1 10*3/uL (ref 0.0–0.5)
Eosinophils Relative: 1 %
HCT: 36.5 % (ref 36.0–46.0)
Hemoglobin: 12.3 g/dL (ref 12.0–15.0)
Immature Granulocytes: 1 %
Lymphocytes Relative: 57 %
Lymphs Abs: 5 10*3/uL — ABNORMAL HIGH (ref 0.7–4.0)
MCH: 30.4 pg (ref 26.0–34.0)
MCHC: 33.7 g/dL (ref 30.0–36.0)
MCV: 90.1 fL (ref 80.0–100.0)
Monocytes Absolute: 0.5 10*3/uL (ref 0.1–1.0)
Monocytes Relative: 6 %
Neutro Abs: 2.9 10*3/uL (ref 1.7–7.7)
Neutrophils Relative %: 34 %
Platelets: 451 10*3/uL — ABNORMAL HIGH (ref 150–400)
RBC: 4.05 MIL/uL (ref 3.87–5.11)
RDW: 12 % (ref 11.5–15.5)
WBC: 8.6 10*3/uL (ref 4.0–10.5)
nRBC: 0 % (ref 0.0–0.2)

## 2023-08-13 LAB — BASIC METABOLIC PANEL WITH GFR
Anion gap: 8 (ref 5–15)
BUN: 18 mg/dL (ref 6–20)
CO2: 26 mmol/L (ref 22–32)
Calcium: 8.9 mg/dL (ref 8.9–10.3)
Chloride: 104 mmol/L (ref 98–111)
Creatinine, Ser: 0.84 mg/dL (ref 0.44–1.00)
GFR, Estimated: >60 mL/min (ref >60)
Glucose, Bld: 90 mg/dL (ref 70–99)
Potassium: 3.4 mmol/L — ABNORMAL LOW (ref 3.5–5.1)
Sodium: 138 mmol/L (ref 135–145)

## 2023-08-13 LAB — HCG, SERUM, QUALITATIVE: Preg, Serum: NEGATIVE

## 2023-08-13 MED ORDER — CLOPIDOGREL BISULFATE 300 MG PO TABS
300.0000 mg | ORAL_TABLET | Freq: Once | ORAL | Status: AC
  Administered 2023-08-13: 300 mg via ORAL
  Filled 2023-08-13: qty 1

## 2023-08-13 MED ORDER — OXYCODONE-ACETAMINOPHEN 5-325 MG PO TABS
1.0000 | ORAL_TABLET | Freq: Once | ORAL | Status: AC
  Administered 2023-08-13: 1 via ORAL
  Filled 2023-08-13: qty 1

## 2023-08-13 MED ORDER — IOHEXOL 350 MG/ML SOLN
75.0000 mL | Freq: Once | INTRAVENOUS | Status: AC | PRN
  Administered 2023-08-13: 75 mL via INTRAVENOUS

## 2023-08-13 MED ORDER — ASPIRIN 81 MG PO CHEW
324.0000 mg | CHEWABLE_TABLET | Freq: Once | ORAL | Status: AC
  Administered 2023-08-13: 324 mg via ORAL
  Filled 2023-08-13: qty 4

## 2023-08-13 MED ORDER — CLOPIDOGREL BISULFATE 75 MG PO TABS: 75.0000 mg | ORAL_TABLET | Freq: Every day | ORAL | 0 refills | Status: DC

## 2023-08-13 MED ORDER — CLOPIDOGREL BISULFATE 75 MG PO TABS: 75.0000 mg | ORAL_TABLET | Freq: Every day | ORAL | 1 refills | Status: DC

## 2023-08-13 MED ORDER — OXYCODONE-ACETAMINOPHEN 5-325 MG PO TABS: 1.0000 | ORAL_TABLET | Freq: Three times a day (TID) | ORAL | 0 refills | Status: DC | PRN

## 2023-08-13 NOTE — ED Provider Notes (Signed)
 THIS IS NOT A NEW ENCOUNTER, SHE SHOULD HAVE BEEN A TRANSFER FROM [facility] FOR MRI AND IS A CONTINUATION OF THAT VISIT.  SHE SHOULD NOT BE CHARGED FOR THIS VISIT, SEE NOTE FROM DR. GLICK ON ENCOUNTER 08/12/23 2315 SIGNED AT 0405 ON 3/30 FOR INITIAL HISTORY, PHYSICAL AND WORKUP.  4:21 AM Assumed care from Dr. Preston Fleeting at [facility], please see their note for full history, physical and decision making until this point. In brief this is a 28 y.o. 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 Needs MRI to ensure no stroke from same.   MRI reassuring. Will restart plavix. Neuro follow up recommended.   Discharge instructions, including strict return precautions for new or worsening symptoms, given. Patient and/or family verbalized understanding and agreement with the plan as described.   Labs, studies and imaging reviewed by myself and considered in medical decision making if ordered. Imaging interpreted by radiology.  Labs Reviewed - No data to display  MR BRAIN WO CONTRAST    (Results Pending)    No follow-ups on file.    Marily Memos, MD 08/13/23 [phone]

## 2023-08-13 NOTE — Discharge Instructions (Addendum)
 Go directly to [facility] Emergency Department to have an MRI scan done.

## 2023-08-13 NOTE — ED Notes (Signed)
 This chart was opened in error.  The pt transferred from [facility] to [facility] for an MRI.  The encounter should never have been discharged and the new encounter should not have been started.  We will go back to the old chart to continue this case.

## 2023-08-13 NOTE — ED Triage Notes (Addendum)
 The pt has had neck pain for 7 hours she was seen    and was sent here for treatment.  She initially had the same dec 7th 2024  at one point tonight she lost visio in both eyes when she bent over to get something   lmp march 24th

## 2023-08-14 ENCOUNTER — Encounter: Payer: Self-pay | Admitting: Neurology

## 2023-08-14 ENCOUNTER — Telehealth: Payer: Self-pay | Admitting: Surgery

## 2023-08-14 NOTE — Telephone Encounter (Signed)
 Received call from [facility] in [place] concerning prescription that was e-scribed yesterday. Clarification provided, No further CM needs identified

## 2023-08-15 ENCOUNTER — Telehealth: Payer: Self-pay | Admitting: Neurology

## 2023-08-15 DIAGNOSIS — I7774 Dissection of vertebral artery: Secondary | ICD-10-CM

## 2023-08-15 NOTE — Telephone Encounter (Signed)
 Called pt and went over recommendations from Dr. Helane Rima. Anne [name]. Pt verbalized understanding.  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 Dr. Jacqualine Mau not available at time of call. I spoke with Dr. Epimenio Foot who recommended she limit this as much as possible. I relayed to pt.   She also asked if she can take percocet and adderall XR together. I relayed I did not see any drug interactions but would run by MD here.   2. She also asked if she should continue verapamil that Dr. Vickey Huger rx'd, hospital did not discuss this with her.    Scheduled pt for f/u with Dr. Vickey Huger 09/19/23 at 2:30pm.   Pt preferred to keep appt w/ SS,NP 01/25/24 9:45am as this was to f/u on sleepiness/adderall.

## 2023-08-15 NOTE — Telephone Encounter (Signed)
 Please call patient, keep aspirin 81 mg and Plavix 75 mg  Laboratory evaluation to rule out treatable etiology   Micki Riley, MD  Levert Feinstein, MD  I agree with dual antiplatelet therapy aspirin and Plavix for at least 3 months and then Plavix alone.  Avoid any strenuous activities which will cause pressure on the neck and lifting heavy weights or contact sports or dancing..Schedule outpt visit in office soon  Orders Placed This Encounter  Procedures   Vitamin B12   RPR   HIV Antibody (routine testing w rflx)   Folate   C-reactive protein   TSH   ANA w/Reflex if Positive   Sedimentation rate   VITAMIN D 25 Hydroxy (Vit-D Deficiency, Fractures)   Lyme Disease Serology w/Reflex   Homocysteine

## 2023-08-15 NOTE — Telephone Encounter (Signed)
 See phone note from 08/15/23

## 2023-08-17 NOTE — Telephone Encounter (Signed)
 Dr. Pearlean Brownie- Dr. Vickey Huger is not back in office until 08/28/23.

## 2023-08-17 NOTE — Telephone Encounter (Signed)
 Spoke w/Pt regarding questions about medications. Dr. Pearlean Brownie recommended continuing to take Adderall and only use percocet if in a lot of pain. Pt stated she takes her Adderall in the morning and only takes percocet when needed and usually later in the day. Discussed Dr. Pearlean Brownie recommendation regarding verapamil that he does not see a reason for her to continue but to discuss this at her 4/23 appt w/Dr. Dohmeier. Reminded Pt not to lift heavy objects due to neck stain. Pt stated she has been trying to avoid that but with her children it is sometimes difficult. Pt also reported still having some issues when reading, that the words will be blurred but when she blinks and refocuses the blurriness goes away. Pt stated thankful for the call.

## 2023-08-17 NOTE — Telephone Encounter (Signed)
 Dr. Pearlean Brownie- yes, the adderall is daily for her and she takes percocet prn but she wants to know if it is safe for her to take both together?   She also wants to know if she should continue Verapamil ordered by Dr. Vickey Huger. The hospital did not go over this with her when they d/c'd her.

## 2023-08-22 ENCOUNTER — Ambulatory Visit: Payer: Medicaid Other | Admitting: Neurology

## 2023-09-06 ENCOUNTER — Ambulatory Visit: Payer: Medicaid Other | Admitting: Neurology

## 2023-09-06 ENCOUNTER — Ambulatory Visit: Admitting: Neurology

## 2023-09-19 ENCOUNTER — Ambulatory Visit: Admitting: Neurology

## 2023-09-20 ENCOUNTER — Encounter: Payer: Self-pay | Admitting: Neurology

## 2023-09-20 ENCOUNTER — Ambulatory Visit: Admitting: Neurology

## 2023-09-20 VITALS — BP 103/73 | HR 81 | Ht 61.0 in | Wt 162.0 lb

## 2023-09-20 DIAGNOSIS — I7774 Dissection of vertebral artery: Secondary | ICD-10-CM | POA: Diagnosis not present

## 2023-09-20 DIAGNOSIS — G4711 Idiopathic hypersomnia with long sleep time: Secondary | ICD-10-CM

## 2023-09-20 MED ORDER — AMPHETAMINE-DEXTROAMPHET ER 20 MG PO CP24: 20.0000 mg | ORAL_CAPSULE | Freq: Every day | ORAL | 0 refills | Status: DC

## 2023-09-20 NOTE — Progress Notes (Signed)
 Provider:  Neomia Banner, MD  Primary Care Physician:  Anne Kelp, NP [address] [place] [address]     Referring Provider: Angelia Kelp, Np [address] [place],  [place] [address]          Chief Complaint according to patient   Patient presents with:                HISTORY OF PRESENT ILLNESS:  Anne [name] is a 28 y.o. 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  Chief concern according to patient :  " neck pain and burning, sharp pain into the paraspinal cervical area , right side.  The pain  comes with rapid head turning or with her 80 year -old son placing his arm around the neck.    Now affecting right and left vertebral arteries.   08-13-2023; Vertebral arteries: New/interval focal irregularity of the mid left V2 vertebral artery with approximately 50% stenosis, compatible with dissection. Right vertebral artery remains small but is better opacified than on the prior    Anne [name] is a 28 y.o. female.   The history is provided by the patient.  Neck Pain She has history of asthma, anxiety and depression, vertebral artery dissection and noted today that she had increased pain in her neck and like she was dizzy when she is bent forward.  She denies headache and denies weakness or numbness or tingling.  She denies any visual changes.  She denies any recent trauma. 4:21 AM Assumed care from Dr. Candelaria [name] at [facility], please see their note for full history, physical and decision making until this point. In brief this is a 28 y.o. 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 Needs MRI to ensure no stroke from same.    MRI reassuring. Will restart plavix . Neuro follow up recommended.     05-03-2023 :  first onset.  " I cracked my neck " Neck trauma, dangerous injury mechanism (Age  12-64y) headache/neck pain/ vision changes left peripheral; Multifocal irregularity of the right vertebral artery with moderate to severe stenosis that is greatest at the C3-C4 level. No pseudoaneurysm. Left vertebral artery is patent without significant stenosis.    Review of Systems: Out of a complete 14 system review, the patient complains of only the following symptoms, and all other reviewed systems are negative.:   Social History   Socioeconomic History   Marital status: Married    Spouse name: Anne [name]   Number of children: 3   Years of education: Not on file   Highest education level: Not on file  Occupational History   Not on file  Tobacco Use   Smoking status: Former   Smokeless tobacco: Never   Tobacco comments:    quit in Jan'21  Vaping Use   Vaping status: Former  Substance and Sexual Activity   Alcohol use: Yes    Comment: Social   Drug use: No   Sexual activity: Yes    Birth control/protection: None  Other Topics Concern   Not on file  Social History Narrative   Anne [name] is in 12 th grade at [employer]. She is doing good this school year.   She lives with her mother. Her siblings live with  their father.   Social Drivers of Corporate investment banker Strain: Low Risk  (06/22/2018)   Overall Financial Resource Strain (CARDIA)    Difficulty of Paying Living Expenses: Not hard at all  Food Insecurity: No Food Insecurity (06/22/2018)   Hunger Vital Sign    Worried About Running Out of Food in the Last Year: Never true    Ran Out of Food in the Last Year: Never true  Transportation Needs: Unmet Transportation Needs (06/22/2018)   PRAPARE - Administrator, Civil Service (Medical): Yes    Lack of Transportation (Non-Medical): Not on file  Physical Activity: Not on file  Stress: Stress Concern Present (06/22/2018)   [employer] of Occupational Health - Occupational Stress Questionnaire    Feeling of Stress : To some extent  Social  Connections: Not on file    Family History  Problem Relation Age of Onset   Depression Mother    Anxiety disorder Mother    Headache Father    Depression Maternal Grandmother    Anxiety disorder Maternal Grandmother    Thyroid disease Maternal Grandmother    Stroke Maternal Grandmother    Hypertension Maternal Grandmother    Depression Maternal Grandfather    Anxiety disorder Maternal Grandfather    Stroke Maternal Grandfather    Hypertension Maternal Grandfather    Heart disease Maternal Grandfather    Melanoma Paternal Grandmother    Hypertension Paternal Grandfather    Heart disease Paternal Grandfather     Past Medical History:  Diagnosis Date   Anemia    Anxiety    Anxiety and depression    Asthma    Asthma    Daytime sleepiness    Depression    doing good   History of cystitis    Infection    UTI   Orthostatic hypotension    Ovarian cyst    Ovarian cyst    "ruptured in 3rd grade"    Past Surgical History:  Procedure Laterality Date   NO PAST SURGERIES       Current Outpatient Medications on File Prior to Visit  Medication Sig Dispense Refill   albuterol  (PROVENTIL  HFA;VENTOLIN  HFA) 108 (90 Base) MCG/ACT inhaler Inhale 1-2 puffs into the lungs every 6 (six) hours as needed for wheezing or shortness of breath.      amphetamine -dextroamphetamine (ADDERALL XR) 20 MG 24 hr capsule TAKE ONE CAPSULE BY MOUTH DAILY 30 capsule 0   aspirin  81 MG chewable tablet Chew 1 tablet (81 mg total) by mouth daily. 90 tablet 0   clopidogrel  (PLAVIX ) 75 MG tablet Take 1 tablet (75 mg total) by mouth daily. 30 tablet 1   oxyCODONE -acetaminophen  (PERCOCET) 5-325 MG tablet Take 1 tablet by mouth every 8 (eight) hours as needed for severe pain (pain score 7-10). 10 tablet 0   verapamil  (CALAN -SR) 180 MG CR tablet Take 1 tablet (180 mg total) by mouth at bedtime. 90 tablet 3   No current facility-administered medications on file prior to visit.    No Known Allergies    DIAGNOSTIC DATA (LABS, IMAGING, TESTING) - I reviewed patient records, labs, notes, testing and imaging myself where available.  Lab Results  Component Value Date   WBC 8.6 08/13/2023   HGB 12.3 08/13/2023   HCT 36.5 08/13/2023   MCV 90.1 08/13/2023   PLT 451 (H) 08/13/2023      Component Value Date/Time   NA 138 08/13/2023 0003   K 3.4 (L) 08/13/2023 0003   CL  104 08/13/2023 0003   CO2 26 08/13/2023 0003   GLUCOSE 90 08/13/2023 0003   BUN 18 08/13/2023 0003   CREATININE 0.84 08/13/2023 0003   CREATININE 0.59 02/20/2015 1403   CALCIUM 8.9 08/13/2023 0003   PROT 7.2 01/25/2022 1200   ALBUMIN 4.0 01/25/2022 1200   AST 28 01/25/2022 1200   ALT 21 01/25/2022 1200   ALKPHOS 71 01/25/2022 1200   BILITOT 0.6 01/25/2022 1200   GFRNONAA >60 08/13/2023 0003   GFRAA >60 04/06/2018 0226   Lab Results  Component Value Date   CHOL 153 02/20/2015   HDL 54 02/20/2015   LDLCALC 77 02/20/2015   TRIG 110 02/20/2015   CHOLHDL 2.8 02/20/2015   Lab Results  Component Value Date   HGBA1C 5.5 02/20/2015   No results found for: "VITAMINB12" Lab Results  Component Value Date   TSH 1.646 02/20/2015    PHYSICAL EXAM:  Today's Vitals   09/20/23 0944  BP: 103/73  Pulse: 81  Weight: 162 lb (73.5 kg)  Height: 5\' 1"  (1.549 m)   Body mass index is 30.61 kg/m.   Wt Readings from Last 3 Encounters:  09/20/23 162 lb (73.5 kg)  08/13/23 166 lb 3.6 oz (75.4 kg)  08/12/23 166 lb 3.6 oz (75.4 kg)     Ht Readings from Last 3 Encounters:  09/20/23 5\' 1"  (1.549 m)  08/13/23 5\' 1"  (1.549 m)  08/12/23 5\' 1"  (1.549 m)      General: The patient is awake, alert and appears not in acute distress. The patient is awake, alert and appears not in acute distress. The patient is well groomed. Head: Normocephalic, atraumatic. Neck is supple. Mallampati 1- lateral pillars are encroaching. ,  neck circumference:14 inches . Nasal airflow  patent.  Retrognathia is mild . TMJ,  no crossbite, c grinder,   Dental status: regular - permanent retainer.  Cardiovascular:  Regular rate and cardiac rhythm by pulse,  without distended neck veins. Respiratory: Lungs are clear to auscultation.  Skin:  Without evidence of ankle edema, or rash. Trunk: The patient's posture is erect.   Neurologic exam : The patient is awake and alert, oriented to place and time.   Memory subjective described as intact.  Attention span & concentration ability appears normal.  Speech is fluent,  without dysarthria, dysphonia or aphasia.  Mood and affect are appropriate.   Cranial nerves: no loss of smell or taste reported  Pupils are equal and briskly reactive to light. Funduscopic exam deferred. .  Extraocular movements in vertical and horizontal planes were intact and without nystagmus. No Diplopia. Visual fields by finger perimetry are intact. Hearing was intact to soft voice and finger rubbing.    Facial sensation intact to fine touch.  Facial motor strength is symmetric and tongue and uvula move midline.  Neck ROM : rotation, tilt and flexion extension were normal for age and shoulder shrug was symmetrical.    Motor exam:  Symmetric bulk, tone and ROM.   Normal tone without cog -wheeling, symmetric grip strength .   Sensory:  Fine touch,vibration were normal.  Left hand fingers tingle,  small finger and ring finger, not new. Normal coordination, no loss of balance.     ASSESSMENT AND PLAN 28 y.o. 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  Symptom of dizziness, tunnel vision transient .   2)  no new trauma ( see onset of initial symptom).   STOP Popping your neck !   3) stay on plavix  and ASA until further notice.   4) Referral for classic neck and brain angiogram by catheter , referral to Dr Alvira Josephs.    I plan to follow up either personally or through Dr Janett Medin  within  4-6 months.   I would like to thank Anne Kelp, NP  for allowing me to meet with and to take care of this pleasant patient.    After spending a total time of  25  minutes face to face and additional time for physical and neurologic examination, review of laboratory studies,  personal review of imaging studies, reports and results of other testing and review of referral information / records as far as provided in visit,   Electronically signed by: Anne Banner, MD 09/20/2023 10:18 AM  [facility] Board certified by The [employer] of Sleep Medicine and Diplomate of the [employer] of Sleep Medicine. Board certified In Neurology through the ABPN, Fellow of the [employer] of Neurology.

## 2023-09-20 NOTE — Patient Instructions (Signed)
Angiogram  An angiogram is a procedure used to examine the blood vessels. In this procedure, contrast dye is injected through a soft tube (catheter) into an artery. X-rays are then taken, which show if there is a blockage or problem in a blood vessel. The catheter may be inserted in: The groin area. This is the most common. The fold of the arm, near the elbow. The wrist. Tell a health care provider about: Any allergies you have, including allergies to medicines, shellfish, contrast dye, or iodine. All medicines you are taking, including vitamins, herbs, eye drops, creams, and over-the-counter medicines. Any problems you or family members have had with anesthesia. Any bleeding problems you have. Any surgeries you have had. Any medical conditions you have or have had, including any kidney problems or kidney failure. Whether you are pregnant or may be pregnant. Whether you are breastfeeding. Any condition that may increase your stress and prevent you from lying still. This includes anxiety disorders or chronic pain. What are the risks? Your health care provider will talk with you about risks. These may include: Infection. Bleeding and bruising. Allergic reactions to medicines or dyes, including the contrast dye used. Damage to nearby structures or organs, including damage to blood vessels and kidney damage from the contrast dye. Blood clots that can lead to a stroke or heart attack. Death. What happens before the procedure? When to stop eating and drinking Follow instructions from your health care provider about what you may eat and drink before your procedure. These may include: 8 hours before your procedure Stop eating most foods. Do not eat meat, fried foods, or fatty foods. Eat only light foods, such as toast or crackers. All liquids are okay except energy drinks and alcohol. 6 hours before your procedure Stop eating. Drink only clear liquids, such as water, clear fruit juice,  black coffee, plain tea, and sports drinks. Do not drink energy drinks or alcohol. 2 hours before your procedure Stop drinking all liquids. You may be allowed to take medicines with small sips of water. If you do not follow your health care provider's instructions, your procedure may be delayed or canceled. Medicines Ask your health care provider about: Changing or stopping your regular medicines. These include any diabetes medicines or blood thinners you take. Taking medicines such as aspirin and ibuprofen. These medicines can thin your blood. Do not take them unless your health care provider tells you to. Taking over-the-counter medicines, vitamins, herbs, and supplements. Surgery safety Ask your health care provider: How your insertion site will be marked. What steps will be taken to help prevent infection. These may include: Removing hair at the insertion site. Washing skin with a germ-killing soap. General instructions Do not use any products that contain nicotine or tobacco for at least 4 weeks before the procedure. These products include cigarettes, chewing tobacco, and vaping devices, such as e-cigarettes. If you need help quitting, ask your health care provider. You may have blood samples taken. If you will be going home right after the procedure, plan to have a responsible adult: Take you home from the hospital or clinic. You will not be allowed to drive. Care for you for the time you are told. What happens during the procedure? You will lie on your back on an X-ray table. You may be strapped to the table if it is tilted. An IV will be inserted into one of your veins. Electrodes may be placed on your chest to monitor your heart rate during the procedure.  You will be given one or both of the following: A sedative. This helps you relax. Anesthesia. This will numb the area where the catheter will be inserted. A small incision will be made for catheter insertion. The catheter  will be inserted into an artery using a guide wire. An X-ray procedure (fluoroscopy) will be used to help guide the catheter to the blood vessel to be examined. A contrast dye will then be injected into the catheter, and X-rays will be taken. The contrast will help to show where any narrowing or blockages are located in the blood vessels. You may feel flushed as the contrast dye is injected. Tell your health care provider if you develop chest pain or trouble breathing. After the fluoroscopy is complete, the catheter will be removed. Pressure will be applied to stop bleeding. A closure device may also be used. A bandage (dressing) will be placed over the site where the catheter was inserted. The procedure may vary among health care providers and hospitals. What happens after the procedure? Your blood pressure, heart rate, breathing rate, and blood oxygen level will be monitored until you leave the hospital or clinic. You will be kept in bed lying flat for a period of time. If the catheter was inserted through your leg, you will be instructed not to bend or cross your legs. The insertion area and the pulse in your feet or wrist will be checked often. You will be instructed to drink plenty of fluids. This will help wash the contrast dye out of your body. You may have more blood tests and X-rays. You may also have a test that records the electrical activity of your heart (electrocardiogram, or ECG). If you were given a sedative, do not drive or operate machinery until your health care provider says that it is safe. You may have to avoid lifting. Ask your health care provider how much you can safely lift. It is up to you to get your test results. Ask your health care provider, or the department that is doing the test, when your results will be ready. This information is not intended to replace advice given to you by your health care provider. Make sure you discuss any questions you have with your health  care provider. Document Revised: 11/24/2021 Document Reviewed: 11/24/2021 Elsevier Patient Education  2024 [employer].

## 2023-09-21 ENCOUNTER — Other Ambulatory Visit (HOSPITAL_COMMUNITY): Payer: Self-pay | Admitting: Interventional Radiology

## 2023-09-21 DIAGNOSIS — I7774 Dissection of vertebral artery: Secondary | ICD-10-CM

## 2023-09-28 ENCOUNTER — Ambulatory Visit (HOSPITAL_COMMUNITY)
Admission: RE | Admit: 2023-09-28 | Discharge: 2023-09-28 | Disposition: A | Discharge status: Home / Self Care | Source: Ambulatory Visit | Attending: Interventional Radiology | Admitting: Interventional Radiology

## 2023-09-28 DIAGNOSIS — I7774 Dissection of vertebral artery: Secondary | ICD-10-CM

## 2023-09-29 HISTORY — PX: IR RADIOLOGIST EVAL & MGMT: IMG5224

## 2023-10-11 ENCOUNTER — Other Ambulatory Visit (HOSPITAL_COMMUNITY): Payer: Self-pay | Admitting: Interventional Radiology

## 2023-10-11 ENCOUNTER — Telehealth (HOSPITAL_COMMUNITY): Payer: Self-pay

## 2023-10-11 DIAGNOSIS — I7774 Dissection of vertebral artery: Secondary | ICD-10-CM

## 2023-10-11 NOTE — Telephone Encounter (Signed)
 Called to schedule diagnostic angiogram. She wants to know if this can be done with general anesthesia or something stronger than moderate sedation? She has very bad ADHD and will not be able to lay still for the procedure with moderate. Will ask Dr. Alvira Josephs and call her back to schedule. AB

## 2023-10-16 ENCOUNTER — Telehealth (HOSPITAL_COMMUNITY): Payer: Self-pay

## 2023-10-16 NOTE — Telephone Encounter (Signed)
 Spoke with Dr. Alvira Josephs regarding pt's request for anesthesia for her diagnostic angiogram. He would like to hold off on diagnostic for right now and do a CTA head and neck at the end of June. Depending on the results of the CTA, then he will determine if angiogram is needed. If needed at that time then may be scheduled with anesthesia. AB

## 2023-10-29 ENCOUNTER — Encounter: Payer: Self-pay | Admitting: Neurology

## 2023-11-02 ENCOUNTER — Ambulatory Visit (HOSPITAL_COMMUNITY)
Admission: RE | Admit: 2023-11-02 | Discharge: 2023-11-02 | Disposition: A | Discharge status: Home / Self Care | Source: Ambulatory Visit | Attending: Interventional Radiology | Admitting: Interventional Radiology

## 2023-11-02 DIAGNOSIS — I7774 Dissection of vertebral artery: Secondary | ICD-10-CM | POA: Insufficient documentation

## 2023-11-02 MED ORDER — SODIUM CHLORIDE (PF) 0.9 % IJ SOLN
INTRAMUSCULAR | Status: AC
Start: 2023-11-02 — End: 2023-11-02
  Filled 2023-11-02: qty 50

## 2023-11-02 MED ORDER — IOHEXOL 350 MG/ML SOLN
75.0000 mL | Freq: Once | INTRAVENOUS | Status: AC | PRN
  Administered 2023-11-02: 75 mL via INTRAVENOUS

## 2023-11-10 ENCOUNTER — Encounter (HOSPITAL_COMMUNITY): Payer: Self-pay | Admitting: Interventional Radiology

## 2023-11-15 ENCOUNTER — Telehealth (HOSPITAL_COMMUNITY): Payer: Self-pay

## 2023-11-15 NOTE — Telephone Encounter (Signed)
Called regarding recent imaging, no answer, left vm. AB

## 2024-01-24 ENCOUNTER — Encounter: Payer: Self-pay | Admitting: Neurology

## 2024-01-25 ENCOUNTER — Encounter: Payer: Self-pay | Admitting: Neurology

## 2024-01-25 ENCOUNTER — Ambulatory Visit: Payer: Medicaid Other | Admitting: Neurology

## 2024-01-25 VITALS — BP 116/64 | HR 76 | Ht 61.0 in | Wt 161.0 lb

## 2024-01-25 DIAGNOSIS — G4711 Idiopathic hypersomnia with long sleep time: Secondary | ICD-10-CM | POA: Diagnosis not present

## 2024-01-25 DIAGNOSIS — I7774 Dissection of vertebral artery: Secondary | ICD-10-CM | POA: Diagnosis not present

## 2024-01-25 MED ORDER — AMPHETAMINE-DEXTROAMPHET ER 30 MG PO CP24
30.0000 mg | ORAL_CAPSULE | Freq: Every day | ORAL | 0 refills | Status: DC
Start: 2024-01-25 — End: 2024-04-02

## 2024-01-25 NOTE — Progress Notes (Addendum)
 Patient: Anne [name] Date of Birth: 1996-04-09  Reason for Visit: Follow up History from: Patient Primary Neurologist: Dohmeier   ASSESSMENT AND PLAN 28 y.o. year old female   1.  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  Continues with intermittent tinnitus, occasional dizzy spells 3.  Idiopathic hypersomnolence  - At this point, can stop aspirin  and stay on Plavix  75 mg daily alone - Referral to Dr. Lester for consultation of bilateral vertebral artery dissections, continues with tinnitus and dizziness -CTA head and neck in June 2025 showed healing bilateral cervical vertebral artery dissections since January and March - Increase Adderall XR 30 day daily for idiopathic hypersomnolence  - Can't keep visit in October with Dr. Rosemarie, will see if check out can find patient another appointment with Dr. Rosemarie, as Dr. Chalice personally requests she see Dr. Rosemarie - Reviewed plan with Dr. Chalice, will also need follow up in 6 months with her  Addendum 01/31/24 SS: Patient had visit with Dr. Lester.  Recommended stopping Plavix  and continue aspirin  81 mg daily.  Stop verapamil .  Repeat CTA in June 2026.  HISTORY OF PRESENT ILLNESS: Today 01/25/24 In summary of extensive review of the chart, March 2025 new left vertebral artery dissection.  December 2024 right vertebral artery dissection.Was last seen in May 2025 by Dr. Chalice.  Advised aspirin  and Plavix  until further notice.  Referral to Dr. Dolphus for cerebral angiogram.  Apparently continue Adderall 20 mg daily for idiopathic hypersomnolence. Back in February Dr. Chalice placed on Adderall.  Tested negative for OSA.  CTA head and neck in June 2025 showed healing bilateral cervical vertebral artery dissections since January and March.   Here today to discuss next steps:  Her neck is still sore, heavy lifting, looking up for too long hurts on the right side (originally woke up with neck pain, felt  she needed to pop her neck, popped it and peripheral vision went out). Tore the left side in March 2025, bathing puppies, later shoulders/neck hurting, stretching muscles, got dizzy, was told left dissection. Was placed on verapamil  in January. was told was vessel relaxant. Mentions random tinnitus. This past weekend, got hot and dizzy, felt like she was going to be sick, sweating, tinnitus, lasted < 5 minutes. Tinnitus occurs twice a week, cannot identify trigger. Feels nauseated often independent of other symptoms.  No headache, continues with right neck soreness. Still is not lifting anything, has 4 kids, youngest is 2, oldest 6, most difficult is not being able to lift them. Remains on aspirin  and plavix . 1-2 episodes of dizziness that can last few hours, now just few episodes of dizziness when turning over in bed only few seconds.   HISTORY  Anne [name] is a 28 y.o. female patient who is here for revisit 09/20/2023 for 28 y.o. female patient for  follow up on VETEBRAL ARTERY DISSECTION> V2 segment.  Chief concern according to patient :   neck pain and burning, sharp pain into the paraspinal cervical area , right side.  The pain  comes with rapid head turning or with her 57 year -old son placing his arm around the neck.    Now affecting right and left vertebral arteries.    08-13-2023; Vertebral arteries: New/interval focal irregularity of the mid left V2 vertebral artery with approximately 50% stenosis, compatible with dissection. Right vertebral artery remains small but is better opacified than on the prior   Anne [name] is a 28 y.o. female.   The  history is provided by the patient.  Neck Pain She has history of asthma, anxiety and depression, vertebral artery dissection and noted today that she had increased pain in her neck and like she was dizzy when she is bent forward.  She denies headache and denies weakness or numbness or tingling.  She denies any visual changes.  She denies any recent trauma. 4:21 AM Assumed  care from Dr. Raford at [facility], please see their note for full history, physical and decision making until this point. In brief this is a 28 y.o. 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 Needs MRI to ensure no stroke from same.    MRI reassuring. Will restart plavix . Neuro follow up recommended.    05-03-2023 :  first onset.   I cracked my neck  Neck trauma, dangerous injury mechanism (Age 26-64y) headache/neck pain/ vision changes left peripheral; Multifocal irregularity of the right vertebral artery with moderate to severe stenosis that is greatest at the C3-C4 level. No pseudoaneurysm. Left vertebral artery is patent without significant stenosis.  REVIEW OF SYSTEMS: Out of a complete 14 system review of symptoms, the patient complains only of the following symptoms, and all other reviewed systems are negative.  See HPI  ALLERGIES: No Known Allergies  HOME MEDICATIONS: Outpatient Medications Prior to Visit  Medication Sig Dispense Refill   albuterol  (PROVENTIL  HFA;VENTOLIN  HFA) 108 (90 Base) MCG/ACT inhaler Inhale 1-2 puffs into the lungs every 6 (six) hours as needed for wheezing or shortness of breath.      amphetamine -dextroamphetamine (ADDERALL XR) 20 MG 24 hr capsule Take 1 capsule (20 mg total) by mouth daily. 30 capsule 0   aspirin  81 MG chewable tablet Chew 1 tablet (81 mg total) by mouth daily. 90 tablet 0   clopidogrel  (PLAVIX ) 75 MG tablet Take 1 tablet (75 mg total) by mouth daily. 30 tablet 1   verapamil  (CALAN -SR) 180 MG CR tablet Take 1 tablet (180 mg total) by mouth at bedtime. 90 tablet 3   No facility-administered medications prior to visit.    PAST MEDICAL HISTORY: Past Medical History:  Diagnosis Date   Anemia    Anxiety    Anxiety and depression    Asthma    Asthma    Daytime sleepiness    Depression    doing good   History of cystitis    Infection    UTI   Orthostatic  hypotension    Ovarian cyst    Ovarian cyst    ruptured in 3rd grade    PAST SURGICAL HISTORY: Past Surgical History:  Procedure Laterality Date   IR RADIOLOGIST EVAL & MGMT  09/29/2023   NO PAST SURGERIES      FAMILY HISTORY: Family History  Problem Relation Age of Onset   Depression Mother    Anxiety disorder Mother    Headache Father    Depression Maternal Grandmother    Anxiety disorder Maternal Grandmother    Thyroid disease Maternal Grandmother    Stroke Maternal Grandmother    Hypertension Maternal Grandmother    Depression Maternal Grandfather    Anxiety disorder Maternal Grandfather    Stroke Maternal Grandfather    Hypertension Maternal Grandfather    Heart disease Maternal Grandfather    Melanoma Paternal Grandmother    Hypertension Paternal Grandfather    Heart disease Paternal Grandfather     SOCIAL HISTORY: Social History   Socioeconomic History  Marital status: Married    Spouse name: Massie   Number of children: 3   Years of education: Not on file   Highest education level: Not on file  Occupational History   Not on file  Tobacco Use   Smoking status: Former   Smokeless tobacco: Never   Tobacco comments:    quit in Jan'21  Vaping Use   Vaping status: Former  Substance and Sexual Activity   Alcohol use: Yes    Comment: Social   Drug use: No   Sexual activity: Yes    Birth control/protection: None  Other Topics Concern   Not on file  Social History Narrative   She lives with her mother husband and 4 kids   Left handed   Social Drivers of Health   Financial Resource Strain: Low Risk  (06/22/2018)   Overall Financial Resource Strain (CARDIA)    Difficulty of Paying Living Expenses: Not hard at all  Food Insecurity: No Food Insecurity (06/22/2018)   Hunger Vital Sign    Worried About Running Out of Food in the Last Year: Never true    Ran Out of Food in the Last Year: Never true  Transportation Needs: Unmet Transportation Needs  (06/22/2018)   PRAPARE - Administrator, Civil Service (Medical): Yes    Lack of Transportation (Non-Medical): Not on file  Physical Activity: Not on file  Stress: Stress Concern Present (06/22/2018)   [employer] of Occupational Health - Occupational Stress Questionnaire    Feeling of Stress : To some extent  Social Connections: Not on file  Intimate Partner Violence: Not At Risk (06/22/2018)   Humiliation, Afraid, Rape, and Kick questionnaire    Fear of Current or Ex-Partner: No    Emotionally Abused: No    Physically Abused: No    Sexually Abused: No    PHYSICAL EXAM  Vitals:   01/25/24 0945  BP: 116/64  Pulse: 76  SpO2: 100%  Weight: 161 lb (73 kg)  Height: 5' 1 (1.549 m)   Body mass index is 30.42 kg/m.  Generalized: Well developed, in no acute distress  Neurological examination  Mentation: Alert oriented to time, place, history taking. Follows all commands speech and language fluent Cranial nerve II-XII: Pupils were equal round reactive to light. Extraocular movements were full, visual field were full on confrontational test. Facial sensation and strength were normal.  Head turning and shoulder shrug  were normal and symmetric. Motor: The motor testing reveals 5 over 5 strength of all 4 extremities. Good symmetric motor tone is noted throughout.  Sensory: Sensory testing is intact to soft touch on all 4 extremities. No evidence of extinction is noted.  Coordination: Cerebellar testing reveals good finger-nose-finger and heel-to-shin bilaterally.  Gait and station: Gait is normal.  Reflexes: Deep tendon reflexes are symmetric and normal bilaterally.   DIAGNOSTIC DATA (LABS, IMAGING, TESTING) - I reviewed patient records, labs, notes, testing and imaging myself where available.  Lab Results  Component Value Date   WBC 8.6 08/13/2023   HGB 12.3 08/13/2023   HCT 36.5 08/13/2023   MCV 90.1 08/13/2023   PLT 451 (H) 08/13/2023      Component Value  Date/Time   NA 138 08/13/2023 0003   K 3.4 (L) 08/13/2023 0003   CL 104 08/13/2023 0003   CO2 26 08/13/2023 0003   GLUCOSE 90 08/13/2023 0003   BUN 18 08/13/2023 0003   CREATININE 0.84 08/13/2023 0003   CREATININE 0.59 02/20/2015 1403  CALCIUM 8.9 08/13/2023 0003   PROT 7.2 01/25/2022 1200   ALBUMIN 4.0 01/25/2022 1200   AST 28 01/25/2022 1200   ALT 21 01/25/2022 1200   ALKPHOS 71 01/25/2022 1200   BILITOT 0.6 01/25/2022 1200   GFRNONAA >60 08/13/2023 0003   GFRAA >60 04/06/2018 0226   Lab Results  Component Value Date   CHOL 153 02/20/2015   HDL 54 02/20/2015   LDLCALC 77 02/20/2015   TRIG 110 02/20/2015   CHOLHDL 2.8 02/20/2015   Lab Results  Component Value Date   HGBA1C 5.5 02/20/2015   No results found for: [id number] Lab Results  Component Value Date   TSH 1.646 02/20/2015   Lauraine Born, AGNP-C, DNP 01/25/2024, 9:53 AM [facility] [address], [address] [place], [place] [address] [phone]

## 2024-01-25 NOTE — Patient Instructions (Signed)
 Increase Adderall XR 30 mg daily Referral to Dr. Lester Stop aspirin , continue Plavix  75 mg daily alone

## 2024-01-30 DIAGNOSIS — I951 Orthostatic hypotension: Secondary | ICD-10-CM | POA: Insufficient documentation

## 2024-01-30 DIAGNOSIS — Z148 Genetic carrier of other disease: Secondary | ICD-10-CM | POA: Insufficient documentation

## 2024-01-30 DIAGNOSIS — Z862 Personal history of diseases of the blood and blood-forming organs and certain disorders involving the immune mechanism: Secondary | ICD-10-CM | POA: Insufficient documentation

## 2024-01-31 ENCOUNTER — Encounter: Payer: Self-pay | Admitting: Neurology

## 2024-01-31 ENCOUNTER — Encounter: Payer: Self-pay | Admitting: Neuroradiology

## 2024-01-31 ENCOUNTER — Ambulatory Visit (INDEPENDENT_AMBULATORY_CARE_PROVIDER_SITE_OTHER): Admitting: Neuroradiology

## 2024-01-31 VITALS — BP 104/70 | HR 80 | Ht 61.0 in | Wt 160.0 lb

## 2024-01-31 DIAGNOSIS — I7774 Dissection of vertebral artery: Secondary | ICD-10-CM

## 2024-01-31 MED ORDER — ASPIRIN 81 MG PO CHEW: 81.0000 mg | CHEWABLE_TABLET | Freq: Every day | ORAL | 4 refills | Status: AC

## 2024-01-31 NOTE — Addendum Note (Signed)
 Addended by: LESTER NANCYANN GAILS on: 01/31/2024 03:16 PM   Modules accepted: Orders

## 2024-01-31 NOTE — Progress Notes (Signed)
 I had the pleasure of meeting Anne [name] in the office today.  Briefly, at the end of 2024, she had a unusual pain in her right shoulder and neck which was followed by loss of peripheral vision which prompted her to go to the emergency room.  She had a CT arteriogram on 05/03/2023 which showed a proximal short segment right vertebral artery dissection.  She had a brain MRI on 05/03/2023 which was negative.  She had a follow-up CT arteriogram on 06/01/2023 which surprisingly demonstrated significant progression of the dissection on the right, now involving the entire cervical ICA with a very small residual lumen.  A repeat brain MRI on 06/16/2023 was also normal.  She had a follow-up CT arteriogram on 08/13/2023.  The right vertebral artery looked much better, nearly normal, but there was a short segment stenosis of the proximal left vertebral artery which appeared to represent a new short segment dissection.  A repeat brain MRI the same day was normal.  She had a repeat CT arteriogram on 11/02/2023 which showed a near normal right vertebral artery, with a normal left vertebral artery.  I have personally reviewed all of the above imaging studies.  She is no longer complaining of neck pain.  She does have occasional ringing in the ears and positional vertigo.  This is not associated with lateralizing numbness or weakness, facial numbness, weakness or pain, diplopia or swallowing difficulty.  She has been treated with aspirin , clopidogrel  and verapamil .  The aspirin  was stopped at some point and she currently is taking clopidogrel  and verapamil , the latter being for any vasospastic component.  On physical exam her blood pressure was 104/70.  Lungs are clear.  Heart rate is regular and heart sounds are normal.  I do not auscultate a bruit in the neck on either side, no bruit at the base of the skull.  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  I do not think her ringing in the ears and vertigo episodes are likely related as they have been very frequent over the last 2 months, not associated with any other neurologic symptoms, and fairly stereotypical.  Her imaging has shown progressive healing of the dissections.  Recommendation:  1.  I think we can stop the clopidogrel  and continue with 81 mg aspirin  2.  We can stop the verapamil .  There is not likely a vasospastic component 3.  Repeat CTA in June 2026 4.  I discussed with her the symptoms related to vertebral artery dissections, and particularly stroke symptoms related to the posterior circulation.  We discussed the ringing in the ears and the occasional positional vertiginous symptoms.  I have told her that as long as the symptoms are the same and not worse, she does not need further investigation.  However, if the symptoms are worse, or associated with other neurologic symptoms such as numbness, weakness, trouble talking or vision abnormalities, she should seek immediate medical attention by dialing 911.

## 2024-02-02 ENCOUNTER — Telehealth: Payer: Self-pay | Admitting: Neurology

## 2024-02-02 NOTE — Telephone Encounter (Signed)
 Referral for Neurology faxed to [facility]   [facility]  Phone:[phone] Fax: [phone]

## 2024-02-13 ENCOUNTER — Telehealth: Payer: Self-pay | Admitting: *Deleted

## 2024-02-13 ENCOUNTER — Ambulatory Visit: Admitting: Neurology

## 2024-02-13 ENCOUNTER — Encounter: Payer: Self-pay | Admitting: Neurology

## 2024-02-13 VITALS — BP 125/83 | HR 101 | Ht 61.0 in | Wt 155.0 lb

## 2024-02-13 DIAGNOSIS — M542 Cervicalgia: Secondary | ICD-10-CM | POA: Diagnosis not present

## 2024-02-13 DIAGNOSIS — I7774 Dissection of vertebral artery: Secondary | ICD-10-CM

## 2024-02-13 DIAGNOSIS — R42 Dizziness and giddiness: Secondary | ICD-10-CM | POA: Diagnosis not present

## 2024-02-13 MED ORDER — MECLIZINE HCL 12.5 MG PO TABS: 12.5000 mg | ORAL_TABLET | Freq: Three times a day (TID) | ORAL | 0 refills | Status: AC | PRN

## 2024-02-13 NOTE — Patient Instructions (Signed)
 I had a long discussion with Anne [name] regarding her small bilateral vertebral artery dissection fortunately has not led to any strokes or TIAs.  I recommend she avoid lifting heavy weights beyond 20-30 pounds and avoid high impact activity.  Continue aspirin  81 mg daily for stroke prevention and maintain aggressive risk factor modification with blood pressure goal below 130/90, lipids with LDL cholesterol goal below 70 mg percent diabetes with hemoglobin A1c goal below 6.5%.  Check lipid profile and hemoglobin A1c today.  Follow-up CT angiogram in June 2026 as suggested by Dr. Lester or earlier if she has neurovascular focal symptoms.  I recommend trial of meclizine 12.5 mg every 8 hourly as needed for her chronic vertigo symptoms and referral to vestibular rehab for dizziness exercises.  I also encouraged her to do regular neck stretching exercises and use local heat application and massage for her musculoskeletal chronic neck pain.  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  Follow-up with me in Anne future only as necessary.  Vertebral Artery Dissection  Vertebral artery dissection is a tear in a vertebral artery. Anne vertebral arteries are major blood vessels at Anne base of Anne neck. They carry blood from Anne heart to Anne brain. When an artery tears, blood collects inside Anne layers of Anne artery wall. This can cause a blood clot. This condition increases Anne risk for stroke if it is not diagnosed and treated right away. It is a common cause of stroke in people who are 32-79 years old. What are Anne causes? This condition may be caused by: A neck injury due to sudden or too much neck movement. This is called a traumatic dissection. Having weak blood vessel walls. Anne walls may tear even when no injury occurs (spontaneous dissection). Chiropractic adjustments. A minimal form of blunt trauma. In many cases, Anne cause of this condition is not known. What increases Anne  risk? Anne following factors may make you more likely to develop this condition: High blood pressure (hypertension). Migraines. Inherited diseases that affect Anne strength or shape of Anne blood vessels. Tobacco use. What are Anne signs or symptoms? Symptoms usually appear within days of an injury, but sometimes they may not appear for weeks or years. Common symptoms of this condition include: Headache, with a sharp, stabbing pain in Anne head, neck, eye, or face. Dizziness. Vertigo. This is a feeling that you or things around you are moving when they are not. Double vision. Other symptoms include: Hoarse voice. Hearing loss. Hiccups. Loss of taste. Difficulty speaking. Loss of balance and coordination. Difficulty swallowing. Ear pain. Nausea and vomiting. Loss of feeling in Anne torso, legs, or arms. How is this diagnosed? This condition is diagnosed based on tests, such as: CT angiogram. This test uses a computer to take X-rays of your vertebral arteries. A dye may be injected into your blood to show Anne inside of your blood vessels more clearly. MRI angiogram. This is used to check Anne health of Anne blood vessels. Cerebral angiogram. This test takes X-ray images of Anne blood vessels in Anne neck and brain. A dye is used to show Anne inside of Anne blood vessels. Doppler ultrasound. This test uses sound waves to create images of Anne vertebral arteries. It shows how well blood flows through your arteries. How is this treated? Treatment depends on Anne cause of your vertebral artery dissection and on your overall health. Anne goal of treatment is to prevent a stroke. If you are having a stroke, it  is important to get treatment quickly. Treatment may include: Blood thinners. This medicine helps to prevent blood clots. This may be given first through an IV, and then as pills for 3-6 months. Antiplatelet medicines. This medicine keeps Anne platelets from sticking together, which prevents blood  clots from forming. Procedures to widen a narrow blood vessel (angioplasty) or to place a mesh tube (stent) inside Anne blood vessel to keep it open. Surgery to repair Anne area. This is rarely needed. Follow these instructions at home: Medicines Take over-Anne-counter and prescription medicines only as told by your health care provider. If you are taking blood thinners: Talk with your health care provider before you take any medicines that contain aspirin  or NSAIDs, such as ibuprofen . These medicines increase your risk for dangerous bleeding. Take your medicine exactly as told, at Anne same time every day. Avoid activities that could cause injury or bruising. Follow instructions about how to prevent falls. Wear a medical alert bracelet or carry a card that lists what medicines you take. Lifestyle  Work with your health care provider to control hypertension. This may include: Exercising regularly. Check with your health care provider before starting a new type of exercise. Eating a heart-healthy diet of fruits, vegetables, whole grains, and lean meats. Limit unhealthy fats. Eat more healthy fats such as avocados, eggs, and oily fish. Reducing Anne amount of salt (sodium) that you eat to less than 1,500 mg a day. Reducing stress by doing things that you enjoy and avoiding things that cause you stress. Avoid activities that put you at risk for neck injuries, such as contact sports. Do not use any products that contain nicotine or tobacco. These products include cigarettes, chewing tobacco, and vaping devices, such as e-cigarettes. If you need help quitting, ask your health care provider. General instructions Do not drink alcohol if: Your health care provider tells you not to drink. You are pregnant, may be pregnant, or are planning to become pregnant. If you drink alcohol: Limit how much you have to: 0-1 drink a day for women. 0-2 drinks a day for men. Know how much alcohol is in your drink. In  [place] one drink equals one 12 oz bottle of beer (355 mL), one 5 oz glass of wine (148 mL), or one 1 oz glass of hard liquor (44 mL). Keep all follow-up visits. This is important. Get help right away if: You have any symptoms of a stroke. BE FAST is an easy way to remember Anne main warning signs of a stroke: B - Balance. Signs are dizziness, sudden trouble walking, or loss of balance. E - Eyes. Signs are trouble seeing or a sudden change in vision. F - Face. Signs are sudden weakness or numbness of Anne face, or Anne face or eyelid drooping on one side. A - Arms. Signs are weakness or numbness in an arm. This happens suddenly and usually on one side of Anne body. S - Speech. Signs are sudden trouble speaking, slurred speech, or trouble understanding what people say. T - Time. Time to call emergency services. Write down what time symptoms started. You have other signs of a stroke, such as: A sudden, severe headache with no known cause. Nausea or vomiting. Seizure. You have other symptoms, such as: Difficulty breathing. Chest pain. These symptoms may be an emergency. Get help right away. Call 911. Do not wait to see if Anne symptoms will go away. Do not drive yourself to Anne hospital. Summary Vertebral artery dissection is a tear  in an artery that carries blood from Anne heart to Anne brain. Symptoms usually appear within days of an injury, but sometimes they may not appear for weeks or years. This condition increases Anne risk for stroke if it is not diagnosed and treated right away. Treatment depends on Anne cause of your condition and on your overall health. Anne goal of treatment is to prevent a stroke. Get help right away if you have any symptoms of a stroke. This information is not intended to replace advice given to you by your health care provider. Make sure you discuss any questions you have with your health care provider. Document Revised: 03/16/2021 Document Reviewed:  03/16/2021 [name]  2024 [employer].
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
## 2024-02-13 NOTE — Telephone Encounter (Signed)
 While here for visit with Dr Rosemarie, pt mentioned that since upping her dose of Adderall XR, her HR has been elevated for period of time after taking it. She said the other day her HR was in the 140s while she was doing laundry. Patient stated she hadn't mentioned anything yet because she thought her body may need more time to adjust to the increased dose.

## 2024-02-14 ENCOUNTER — Telehealth: Payer: Self-pay | Admitting: Neurology

## 2024-02-14 LAB — LIPID PANEL
Chol/HDL Ratio: 3.3 ratio (ref 0.0–4.4)
Cholesterol, Total: 175 mg/dL (ref 100–199)
HDL: 53 mg/dL (ref >39)
LDL Chol Calc (NIH): 105 mg/dL — ABNORMAL HIGH (ref 0–99)
Triglycerides: 90 mg/dL (ref 0–149)
VLDL Cholesterol Cal: 17 mg/dL (ref 5–40)

## 2024-02-14 LAB — HEMOGLOBIN A1C
Est. average glucose Bld gHb Est-mCnc: 100 mg/dL
Hgb A1c MFr Bld: 5.1 % (ref 4.8–5.6)

## 2024-02-14 NOTE — Telephone Encounter (Signed)
 Referral for Physical Therapy faxed to [facility]. Phone: [phone] Fax: [phone]

## 2024-02-25 ENCOUNTER — Ambulatory Visit: Payer: Self-pay | Admitting: Neurology

## 2024-03-11 ENCOUNTER — Institutional Professional Consult (permissible substitution): Admitting: Neurology

## 2024-03-12 ENCOUNTER — Telehealth: Payer: Self-pay | Admitting: Neurology

## 2024-03-12 NOTE — Telephone Encounter (Signed)
 Patient was seen at [facility] 03/04/2024 by Dr. Lynwood Rouse at [facility] neurointerventional clinic.  Recommended medical management.  At this time no indication for endovascular treatment.  Most recent CTA in June 2025 showed improvement in bilateral vertebral artery dissections.

## 2024-03-25 ENCOUNTER — Institutional Professional Consult (permissible substitution): Admitting: Neurology

## 2024-04-01 ENCOUNTER — Other Ambulatory Visit: Payer: Self-pay | Admitting: Neurology

## 2024-04-01 NOTE — Telephone Encounter (Signed)
 Requested Prescriptions   Pending Prescriptions Disp Refills   amphetamine -dextroamphetamine (ADDERALL XR) 30 MG 24 hr capsule [Pharmacy Med Name: dextroamphetamine-amphetamine  ER 30 mg 24hr capsule,extend release] 30 capsule 0    Sig: TAKE ONE CAPSULE BY MOUTH EVERY DAY   Last seen 02/13/24 Next appt 07/29/24  Dispenses   Dispensed Days Supply Quantity Provider Pharmacy  dextroamphetamine-amphetamine  ER 30 mg 24hr capsule,extend release 01/25/2024 30 30 each Gayland Lauraine PARAS, NP [facility]  dextroamphetamine-amphetamine  ER 20 mg 24hr capsule,extend release 09/20/2023 30 30 each Dohmeier, Dedra, MD [facility]  dextroamphetamine-amphetamine  ER 20 mg 24hr capsule,extend release 08/11/2023 30 30 each Dohmeier, Dedra, MD [facility]  dextroamphetamine-amphetamine  ER 20 mg 24hr capsule,extend release 07/10/2023 30 30 each Dohmeier, Dedra, MD [email or web address]

## 2024-04-15 ENCOUNTER — Telehealth: Payer: Self-pay

## 2024-04-15 NOTE — Telephone Encounter (Signed)
 Faxed Therapy Plan of Care to [facility] [phone] on 04/15/24.

## 2024-04-18 ENCOUNTER — Encounter: Payer: Self-pay | Admitting: Neurology

## 2024-04-18 DIAGNOSIS — I7774 Dissection of vertebral artery: Secondary | ICD-10-CM

## 2024-04-18 DIAGNOSIS — G90A Postural orthostatic tachycardia syndrome (POTS): Secondary | ICD-10-CM

## 2024-04-18 DIAGNOSIS — G43109 Migraine with aura, not intractable, without status migrainosus: Secondary | ICD-10-CM

## 2024-05-06 ENCOUNTER — Inpatient Hospital Stay: Admission: RE | Admit: 2024-05-06 | Discharge: 2024-05-06 | Attending: Neurology | Admitting: Neurology

## 2024-05-06 DIAGNOSIS — G43109 Migraine with aura, not intractable, without status migrainosus: Secondary | ICD-10-CM

## 2024-05-06 DIAGNOSIS — I7774 Dissection of vertebral artery: Secondary | ICD-10-CM

## 2024-05-06 DIAGNOSIS — G90A Postural orthostatic tachycardia syndrome (POTS): Secondary | ICD-10-CM

## 2024-05-06 MED ORDER — IOPAMIDOL (ISOVUE-370) INJECTION 76%
75.0000 mL | Freq: Once | INTRAVENOUS | Status: AC | PRN
  Administered 2024-05-06: 75 mL via INTRAVENOUS

## 2024-05-21 ENCOUNTER — Ambulatory Visit: Payer: Self-pay | Admitting: Neurology

## 2024-05-28 ENCOUNTER — Encounter: Payer: Self-pay | Admitting: Neurology

## 2024-05-28 NOTE — Telephone Encounter (Signed)
 Zelda, are you able to resend the referral placed in Sept?

## 2024-05-29 NOTE — Telephone Encounter (Signed)
 Referral for physical therapy has been refaxed to [facility]. Phone: [phone], Fax: [phone]

## 2024-05-29 NOTE — Telephone Encounter (Signed)
 Left a voicemail: have refax to [facility] and included the phone number.

## 2024-07-29 ENCOUNTER — Ambulatory Visit: Admitting: Neurology

## 2024-08-20 ENCOUNTER — Ambulatory Visit: Admitting: Neurology

## 2025-01-30 ENCOUNTER — Ambulatory Visit: Admitting: Neuroradiology
